# Patient Record
Sex: Female | Born: 1995 | Race: Black or African American | Hispanic: No | Marital: Single | Smoking: Never smoker
Health system: Southern US, Community
[De-identification: ages and names within clinical notes are randomized; demographics above are authoritative.]

## PROBLEM LIST (undated history)

## (undated) DIAGNOSIS — J45909 Unspecified asthma, uncomplicated: Secondary | ICD-10-CM

## (undated) DIAGNOSIS — L309 Dermatitis, unspecified: Secondary | ICD-10-CM

---

## 2017-08-01 ENCOUNTER — Other Ambulatory Visit: Payer: Self-pay

## 2017-08-01 ENCOUNTER — Ambulatory Visit (HOSPITAL_COMMUNITY)
Admission: EM | Admit: 2017-08-01 | Discharge: 2017-08-01 | Disposition: A | Discharge status: Home / Self Care | Payer: Federal, State, Local not specified - PPO

## 2017-08-01 ENCOUNTER — Encounter (HOSPITAL_COMMUNITY): Payer: Self-pay | Admitting: *Deleted

## 2017-08-01 DIAGNOSIS — R0602 Shortness of breath: Secondary | ICD-10-CM

## 2017-08-01 DIAGNOSIS — R059 Cough, unspecified: Secondary | ICD-10-CM

## 2017-08-01 DIAGNOSIS — R05 Cough: Secondary | ICD-10-CM

## 2017-08-01 DIAGNOSIS — R0789 Other chest pain: Secondary | ICD-10-CM

## 2017-08-01 DIAGNOSIS — J4540 Moderate persistent asthma, uncomplicated: Secondary | ICD-10-CM

## 2017-08-01 DIAGNOSIS — R062 Wheezing: Secondary | ICD-10-CM

## 2017-08-01 DIAGNOSIS — J9801 Acute bronchospasm: Secondary | ICD-10-CM

## 2017-08-01 HISTORY — DX: Unspecified asthma, uncomplicated: J45.909

## 2017-08-01 HISTORY — DX: Dermatitis, unspecified: L30.9

## 2017-08-01 MED ORDER — PREDNISONE 20 MG PO TABS: ORAL_TABLET | ORAL | 0 refills | Status: DC

## 2017-08-01 NOTE — ED Provider Notes (Signed)
  MRN: [id number] DOB: 03-Aug-1995  Subjective:   [name] is a 22 y.o. female presenting for 5 month history of persistent shob, wheezing, chest tightness and occasional mid to upper chest pain.  Patient [facility].  She was supposed to be taking Flovent but did not like the way of make her feel so she stopped this on her own.  She has been using her albuterol inhaler 10-12 times per day.  She was given a steroid course back in December 2018 and states that she might of gotten a little better with this.  Today, she denies active chest pain, fever, nausea, vomiting, belly pain.  She takes an over-the-counter medication for allergies but cannot recall what it is.  She is also taking Singulair.  No current facility-administered medications for this encounter.   Current Outpatient Medications:  .  albuterol (PROVENTIL HFA;VENTOLIN HFA) 108 (90 Base) MCG/ACT inhaler, Inhale into the lungs every 6 (six) hours as needed for wheezing or shortness of breath., Disp: , Rfl:  .  albuterol (PROVENTIL) (5 MG/ML) 0.5% nebulizer solution, Take 2.5 mg by nebulization every 6 (six) hours as needed for wheezing or shortness of breath., Disp: , Rfl:  .  fluticasone (FLOVENT HFA) 110 MCG/ACT inhaler, Inhale into the lungs 2 (two) times daily., Disp: , Rfl:  .  montelukast (SINGULAIR) 10 MG tablet, Take 10 mg by mouth at bedtime., Disp: , Rfl:  .  norethindrone-ethinyl estradiol (JUNEL FE,GILDESS FE,LOESTRIN FE) 1-20 MG-MCG tablet, Take 1 tablet by mouth daily., Disp: , Rfl:  .  triamcinolone cream (KENALOG) 0.1 %, Apply 1 application topically 2 (two) times daily., Disp: , Rfl:     Allergies  Allergen Reactions  . Other     Peanuts, raw carrots, raw apples    Past Medical History:  Diagnosis Date  . Asthma   . Eczema     Objective:   Vitals: BP 139/77   Pulse (!) 112   Temp 98.6 F (37 C) (Oral)   Resp (!) 22   LMP 07/10/2017 (Exact Date)   SpO2 98%   Physical Exam   Constitutional: She is oriented to person, place, and time. She appears well-developed and well-nourished.  HENT:  Mouth/Throat: Oropharynx is clear and moist.  No sinus tenderness.  Eyes: Right eye exhibits no discharge. Left eye exhibits no discharge.  Cardiovascular: Regular rhythm and intact distal pulses. Exam reveals no gallop and no friction rub.  No murmur heard. Tachycardia noted.  Pulmonary/Chest: No stridor. No respiratory distress. She has no wheezes. She has no rales. She exhibits no tenderness.  Diminished lung sounds throughout.  Neurological: She is alert and oriented to person, place, and time.  Skin: Skin is warm and dry.  Psychiatric: She has a normal mood and affect.    Assessment and Plan :   Shortness of breath  Wheezing  Atypical chest pain  Cough  Bronchospasm  Moderate persistent extrinsic asthma without complication  Counseled patient on appropriate use of her albuterol inhaler.  Will start patient on a short steroid course.  Physical exam findings are reassuring today, will hold off on chest x-ray.  Patient is to follow-up with her PCP to make sure she gets back onto an inhaled steroid.  In the meantime she is to maintain her allergy medicine, Singulair.   Wallis BambergMani, Maddox Bratcher, PA-C 08/02/17 1408

## 2017-08-01 NOTE — Discharge Instructions (Addendum)
Hydrate well with at least 2 liters (1 gallon) of water daily.  °

## 2017-08-01 NOTE — ED Triage Notes (Signed)
C/O dyspnea regularly over past 5 months.  C/o feeling winded after walking very short distances.  Occasionally has chest pains also.  Occasionally has feeling of wheezing.  Has been taking inhalers and albuterol neb regularly without any relief.  States had course steroids in Dec 2018, but no change.

## 2017-08-02 ENCOUNTER — Encounter (HOSPITAL_COMMUNITY): Payer: Self-pay | Admitting: Urgent Care

## 2017-08-10 ENCOUNTER — Emergency Department (HOSPITAL_COMMUNITY): Payer: Federal, State, Local not specified - PPO

## 2017-08-10 ENCOUNTER — Inpatient Hospital Stay (HOSPITAL_COMMUNITY)
Admission: EM | Admit: 2017-08-10 | Discharge: 2017-08-13 | DRG: 176 | Disposition: A | Discharge status: Home / Self Care | Payer: Federal, State, Local not specified - PPO | Attending: Internal Medicine | Admitting: Internal Medicine

## 2017-08-10 ENCOUNTER — Other Ambulatory Visit: Payer: Self-pay

## 2017-08-10 ENCOUNTER — Encounter (HOSPITAL_COMMUNITY): Payer: Self-pay

## 2017-08-10 DIAGNOSIS — I2729 Other secondary pulmonary hypertension: Secondary | ICD-10-CM | POA: Diagnosis present

## 2017-08-10 DIAGNOSIS — R0603 Acute respiratory distress: Secondary | ICD-10-CM | POA: Diagnosis present

## 2017-08-10 DIAGNOSIS — I2699 Other pulmonary embolism without acute cor pulmonale: Secondary | ICD-10-CM | POA: Diagnosis not present

## 2017-08-10 DIAGNOSIS — N83209 Unspecified ovarian cyst, unspecified side: Secondary | ICD-10-CM | POA: Diagnosis present

## 2017-08-10 DIAGNOSIS — J4530 Mild persistent asthma, uncomplicated: Secondary | ICD-10-CM

## 2017-08-10 DIAGNOSIS — D509 Iron deficiency anemia, unspecified: Secondary | ICD-10-CM | POA: Diagnosis not present

## 2017-08-10 DIAGNOSIS — Z91010 Allergy to peanuts: Secondary | ICD-10-CM

## 2017-08-10 DIAGNOSIS — Z91018 Allergy to other foods: Secondary | ICD-10-CM

## 2017-08-10 DIAGNOSIS — Z91048 Other nonmedicinal substance allergy status: Secondary | ICD-10-CM

## 2017-08-10 DIAGNOSIS — Z7951 Long term (current) use of inhaled steroids: Secondary | ICD-10-CM

## 2017-08-10 DIAGNOSIS — J45909 Unspecified asthma, uncomplicated: Secondary | ICD-10-CM | POA: Diagnosis present

## 2017-08-10 DIAGNOSIS — I2781 Cor pulmonale (chronic): Secondary | ICD-10-CM | POA: Diagnosis present

## 2017-08-10 DIAGNOSIS — Z888 Allergy status to other drugs, medicaments and biological substances status: Secondary | ICD-10-CM

## 2017-08-10 DIAGNOSIS — Z79899 Other long term (current) drug therapy: Secondary | ICD-10-CM

## 2017-08-10 LAB — FOLATE: Folate: 19.6 ng/mL (ref >5.9)

## 2017-08-10 LAB — CBC
HCT: 34.1 % — ABNORMAL LOW (ref 36.0–46.0)
Hemoglobin: 10.1 g/dL — ABNORMAL LOW (ref 12.0–15.0)
MCH: 21.4 pg — AB (ref 26.0–34.0)
MCHC: 29.6 g/dL — AB (ref 30.0–36.0)
MCV: 72.2 fL — AB (ref 78.0–100.0)
Platelets: 391 10*3/uL (ref 150–400)
RBC: 4.72 MIL/uL (ref 3.87–5.11)
RDW: 16.9 % — AB (ref 11.5–15.5)
WBC: 8.9 10*3/uL (ref 4.0–10.5)

## 2017-08-10 LAB — BASIC METABOLIC PANEL
Anion gap: 11 (ref 5–15)
BUN: 11 mg/dL (ref 6–20)
CALCIUM: 9.3 mg/dL (ref 8.9–10.3)
CHLORIDE: 107 mmol/L (ref 101–111)
CO2: 18 mmol/L — AB (ref 22–32)
CREATININE: 0.86 mg/dL (ref 0.44–1.00)
GFR calc Af Amer: >60 mL/min (ref >60)
GFR calc non Af Amer: >60 mL/min (ref >60)
GLUCOSE: 87 mg/dL (ref 65–99)
Potassium: 4.3 mmol/L (ref 3.5–5.1)
Sodium: 136 mmol/L (ref 135–145)

## 2017-08-10 LAB — I-STAT TROPONIN, ED: TROPONIN I, POC: 0 ng/mL (ref 0.00–0.08)

## 2017-08-10 LAB — RETICULOCYTES
RBC.: 4.69 MIL/uL (ref 3.87–5.11)
RETIC COUNT ABSOLUTE: 61 10*3/uL (ref 19.0–186.0)
Retic Ct Pct: 1.3 % (ref 0.4–3.1)

## 2017-08-10 LAB — D-DIMER, QUANTITATIVE: D-Dimer, Quant: 1.77 ug/mL-FEU — ABNORMAL HIGH (ref 0.00–0.50)

## 2017-08-10 LAB — I-STAT BETA HCG BLOOD, ED (MC, WL, AP ONLY): I-stat hCG, quantitative: <5 m[IU]/mL (ref <5)

## 2017-08-10 LAB — TROPONIN I

## 2017-08-10 MED ORDER — SODIUM CHLORIDE 0.9% FLUSH
3.0000 mL | Freq: Two times a day (BID) | INTRAVENOUS | Status: DC
  Administered 2017-08-10 – 2017-08-12 (×4): 3 mL via INTRAVENOUS

## 2017-08-10 MED ORDER — SENNOSIDES-DOCUSATE SODIUM 8.6-50 MG PO TABS: 1.0000 | ORAL_TABLET | Freq: Every evening | ORAL | Status: DC | PRN

## 2017-08-10 MED ORDER — SODIUM CHLORIDE 0.9 % IV SOLN
250.0000 mL | INTRAVENOUS | Status: DC | PRN
  Administered 2017-08-12: 250 mL via INTRAVENOUS

## 2017-08-10 MED ORDER — MONTELUKAST SODIUM 10 MG PO TABS
10.0000 mg | ORAL_TABLET | Freq: Every day | ORAL | Status: DC
  Administered 2017-08-10 – 2017-08-12 (×3): 10 mg via ORAL
  Filled 2017-08-10 (×3): qty 1

## 2017-08-10 MED ORDER — ONDANSETRON HCL 4 MG/2ML IJ SOLN: 4.0000 mg | Freq: Four times a day (QID) | INTRAMUSCULAR | Status: DC | PRN

## 2017-08-10 MED ORDER — IOPAMIDOL (ISOVUE-370) INJECTION 76%
INTRAVENOUS | Status: AC
  Filled 2017-08-10: qty 100

## 2017-08-10 MED ORDER — ALBUTEROL SULFATE (2.5 MG/3ML) 0.083% IN NEBU
2.5000 mg | INHALATION_SOLUTION | Freq: Four times a day (QID) | RESPIRATORY_TRACT | Status: DC | PRN
  Filled 2017-08-10: qty 3

## 2017-08-10 MED ORDER — ONDANSETRON HCL 4 MG PO TABS: 4.0000 mg | ORAL_TABLET | Freq: Four times a day (QID) | ORAL | Status: DC | PRN

## 2017-08-10 MED ORDER — BUDESONIDE 0.25 MG/2ML IN SUSP
0.2500 mg | Freq: Two times a day (BID) | RESPIRATORY_TRACT | Status: DC
  Administered 2017-08-11 – 2017-08-13 (×5): 0.25 mg via RESPIRATORY_TRACT
  Filled 2017-08-10 (×5): qty 2

## 2017-08-10 MED ORDER — HEPARIN (PORCINE) IN NACL 100-0.45 UNIT/ML-% IJ SOLN
1250.0000 [IU]/h | INTRAMUSCULAR | Status: AC
  Administered 2017-08-10: 1500 [IU]/h via INTRAVENOUS
  Administered 2017-08-11: 1400 [IU]/h via INTRAVENOUS
  Administered 2017-08-12 (×2): 1300 [IU]/h via INTRAVENOUS
  Filled 2017-08-10 (×4): qty 250

## 2017-08-10 MED ORDER — SODIUM CHLORIDE 0.9% FLUSH
3.0000 mL | Freq: Two times a day (BID) | INTRAVENOUS | Status: DC
  Administered 2017-08-10 – 2017-08-11 (×2): 3 mL via INTRAVENOUS

## 2017-08-10 MED ORDER — ACETAMINOPHEN 325 MG PO TABS: 650.0000 mg | ORAL_TABLET | Freq: Four times a day (QID) | ORAL | Status: DC | PRN

## 2017-08-10 MED ORDER — HEPARIN BOLUS VIA INFUSION
5500.0000 [IU] | Freq: Once | INTRAVENOUS | Status: AC
  Administered 2017-08-10: 5500 [IU] via INTRAVENOUS
  Filled 2017-08-10: qty 5500

## 2017-08-10 MED ORDER — BISACODYL 5 MG PO TBEC: 5.0000 mg | DELAYED_RELEASE_TABLET | Freq: Every day | ORAL | Status: DC | PRN

## 2017-08-10 MED ORDER — FLUTICASONE PROPIONATE HFA 110 MCG/ACT IN AERO: 1.0000 | INHALATION_SPRAY | Freq: Two times a day (BID) | RESPIRATORY_TRACT | Status: DC

## 2017-08-10 MED ORDER — ACETAMINOPHEN 650 MG RE SUPP: 650.0000 mg | Freq: Four times a day (QID) | RECTAL | Status: DC | PRN

## 2017-08-10 MED ORDER — SODIUM CHLORIDE 0.9% FLUSH: 3.0000 mL | INTRAVENOUS | Status: DC | PRN

## 2017-08-10 MED ORDER — IOPAMIDOL (ISOVUE-370) INJECTION 76%
100.0000 mL | Freq: Once | INTRAVENOUS | Status: AC | PRN
  Administered 2017-08-10: 100 mL via INTRAVENOUS

## 2017-08-10 MED ORDER — HYDROCODONE-ACETAMINOPHEN 5-325 MG PO TABS: 1.0000 | ORAL_TABLET | ORAL | Status: DC | PRN

## 2017-08-10 MED ORDER — KETOROLAC TROMETHAMINE 30 MG/ML IJ SOLN: 30.0000 mg | Freq: Four times a day (QID) | INTRAMUSCULAR | Status: DC | PRN

## 2017-08-10 NOTE — ED Notes (Signed)
Patient currently at CT scan .  

## 2017-08-10 NOTE — Progress Notes (Addendum)
ANTICOAGULATION CONSULT NOTE - Initial Consult  Pharmacy Consult for heparin Indication: pulmonary embolus  Allergies  Allergen Reactions  . Other     Peanuts, raw carrots, raw apples    Patient Measurements: Height: 5\' 9"  (175.3 cm) Weight: 220 lb (99.8 kg) IBW/kg (Calculated) : 66.2 Heparin Dosing Weight: 87.9 kg  Vital Signs: Temp: 98.5 F (36.9 C) (04/23 1835) Temp Source: Oral (04/23 1835) BP: 121/83 (04/23 1944) Pulse Rate: 95 (04/23 1944)  Labs: Recent Labs    08/10/17 1538  HGB 10.1*  HCT 34.1*  PLT 391  CREATININE 0.86    Estimated Creatinine Clearance: 130 mL/min (by C-G formula based on SCr of 0.86 mg/dL).   Medical History: Past Medical History:  Diagnosis Date  . Asthma   . Eczema     Medications:  Scheduled:   Assessment: 22 yo female presented ED on 4/23 with chest pain and SOB. Patient is taking OCPs. CT shows acute PE to both lower lobes within the proximal lobar arteries. Hgb 10.1, plts 391.   Goal of Therapy:  Heparin level 0.3-0.7 units/ml Monitor platelets by anticoagulation protocol: Yes   Plan:  Give 5,500 units bolus x 1 Start heparin infusion at 1,500 units/hr Check anti-Xa level in 6 hours and daily while on heparin Continue to monitor H&H and platelets  Maurico Perrell A Vidal Lampkins 08/10/2017,8:17 PM

## 2017-08-10 NOTE — ED Notes (Signed)
EDP/PA explained admission plan to pt.

## 2017-08-10 NOTE — ED Triage Notes (Signed)
Pt c/o CP that is worse with deep breaths. Pt states that she has had CP since November with her breathing getting worse over the past couple of weeks. Pt states she has been seen by her PCP and has been referred to cardiologist but has not had the apt. Yet.

## 2017-08-10 NOTE — ED Provider Notes (Signed)
Patient placed in Quick Look pathway, seen and evaluated   Chief Complaint: Chest pain, SOB  HPI:   Patient presents for evaluation of intermittent, progressively worsening shortness of breath and chest pain.  She states she has been expressing this for the past several months but worsened over the past couple of weeks.  She states that her symptoms are different today as they have been more persistent since last night.  She woke around 9 AM today with left-sided chest pain which radiated to her back.  She notes associated shortness of breath.  Pain and shortness of breath worsened with any activity.  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  She denies loss of consciousness.  She states that the pain was so bad that she began crying.  She has been seen and evaluated by her primary care physician and an urgent care in the past and was given prednisone which has not been helpful.  She has not used her albuterol inhaler in several days but has been taking Singulair, Advair Diskus without significant relief.  She is a non-smoker.  No recent travel or surgeries, no hemoptysis, no fever.  She is on OCPs.  ROS: Positive for chest pain, shortness of breath, lightheadedness, nausea negative for fever, hemoptysis, syncope  Physical Exam:   Gen: No distress  Neuro: Awake and Alert  Skin: Warm    Focused Exam: Tachycardic, mildly tachypneic.  No murmurs rubs or gallops.  2+ radial and DP/PT pulses bilaterally.  Homans sign absent bilaterally, no lower extremity edema.  Lungs are clear to auscultation bilaterally, no tenderness to palpation of the chest wall.  Speaking in full sentences without difficulty.   Initiation of care has begun. The patient has been counseled on the process, plan, and necessity for staying for the completion/evaluation, and the remainder of the medical screening examination    Bennye AlmFawze, Isamar Wellbrock A, PA-C 08/10/17 1553     Loren RacerYelverton, David, MD 08/12/17 1005

## 2017-08-10 NOTE — ED Notes (Signed)
Patient transported to CT scan . 

## 2017-08-10 NOTE — ED Provider Notes (Signed)
[facility] EMERGENCY DEPARTMENT Provider Note   CSN: [id number] Arrival date & time: 08/10/17  1511     History   Chief Complaint Chief Complaint  Patient presents with  . Chest Pain    HPI Asante Deuser is a 22 y.o. female.  HPI   22 year old female presents today with complaints of chest pain and shortness of breath.  Patient notes several month history of worsening over the last several weeks.  She notes she has been seen as an outpatient by her primary care provider with change in her asthma medications.  Patient notes she has continued to have shortness of breath despite no wheezing.  Patient denies any history DVT or PE.  Patient reports she does not smoke, she does take birth control, denies any lower extremity swelling or edema prolonged immobilization, recent surgeries or trauma.  Patient denies any history of bleeds or coagulopathies.  She does note an aunt with history of pulmonary embolism.  Her.  Past Medical History:  Diagnosis Date  . Asthma   . Eczema     Patient Active Problem List   Diagnosis Date Noted  . Asthma 08/10/2017  . Acute pulmonary embolism (HCC) 08/10/2017    History reviewed. No pertinent surgical history.   OB History   None      Home Medications    Prior to Admission medications   Medication Sig Start Date End Date Taking? Authorizing Provider  albuterol (PROVENTIL HFA;VENTOLIN HFA) 108 (90 Base) MCG/ACT inhaler Inhale into the lungs every 6 (six) hours as needed for wheezing or shortness of breath.    [provider]  albuterol (PROVENTIL) (5 MG/ML) 0.5% nebulizer solution Take 2.5 mg by nebulization every 6 (six) hours as needed for wheezing or shortness of breath.    [provider]  fluticasone (FLOVENT HFA) 110 MCG/ACT inhaler Inhale into the lungs 2 (two) times daily.    [provider]  montelukast (SINGULAIR) 10 MG tablet Take 10 mg by mouth at bedtime.    [provider]  norethindrone-ethinyl estradiol (JUNEL FE,GILDESS FE,LOESTRIN FE) 1-20 MG-MCG tablet Take 1 tablet by mouth daily.    [provider]  predniSONE (DELTASONE) 20 MG tablet Take 2 tablets daily with breakfast. 08/01/17   Wallis Bamberg, PA-C  triamcinolone cream (KENALOG) 0.1 % Apply 1 application topically 2 (two) times daily.    [provider]    Family History History reviewed. No pertinent family history.  Social History Social History   Tobacco Use  . Smoking status: Never Smoker  . Smokeless tobacco: Never Used  Substance Use Topics  . Alcohol use: Never    Frequency: Never  . Drug use: Never     Allergies   Other   Review of Systems Review of Systems  All other systems reviewed and are negative.    Physical Exam Updated Vital Signs BP 134/87   Pulse 89   Temp 98.5 F (36.9 C) (Oral)   Resp 14   Ht 5\' 9"  (1.753 m)   Wt 99.8 kg (220 lb)   LMP 08/07/2017   SpO2 99%   BMI 32.49 kg/m   Physical Exam  Constitutional: She is oriented to person, place, and time. She appears well-developed and well-nourished.  HENT:  Head: Normocephalic and atraumatic.  Eyes: Pupils are equal, round, and reactive to light. Conjunctivae are normal. Right eye exhibits no discharge. Left eye exhibits no discharge. No scleral icterus.  Neck: Normal range of motion. No JVD  present. No tracheal deviation present.  Cardiovascular: Normal rate, regular rhythm, normal heart sounds and intact distal pulses.  Pulmonary/Chest: Effort normal and breath sounds normal. No stridor. No respiratory distress. She has no wheezes. She has no rales. She exhibits no tenderness.  Musculoskeletal: She exhibits no edema.  Neurological: She is alert and oriented to person, place, and time. Coordination normal.  Psychiatric: She has a normal mood and affect. Her behavior is normal. Judgment and thought content normal.  Nursing note and vitals reviewed.    ED Treatments / Results   Labs (all labs ordered are listed, but only abnormal results are displayed) Labs Reviewed  BASIC METABOLIC PANEL - Abnormal; Notable for the following components:      Result Value   CO2 18 (*)    All other components within normal limits  CBC - Abnormal; Notable for the following components:   Hemoglobin 10.1 (*)    HCT 34.1 (*)    MCV 72.2 (*)    MCH 21.4 (*)    MCHC 29.6 (*)    RDW 16.9 (*)    All other components within normal limits  D-DIMER, QUANTITATIVE (NOT AT [facility]) - Abnormal; Notable for the following components:   D-Dimer, Quant 1.77 (*)    All other components within normal limits  HEPARIN LEVEL (UNFRACTIONATED)  CBC  I-STAT TROPONIN, ED  I-STAT BETA HCG BLOOD, ED ([facility], [facility], [facility] ONLY)    EKG None  Radiology Dg Chest 2 View  Result Date: 08/10/2017 CLINICAL DATA:  Left-sided chest pain.  Shortness of breath. EXAM: CHEST - 2 VIEW COMPARISON:  None. FINDINGS: Blunting of the left costophrenic angle is best seen on the AP view suggesting a small effusion or pleural thickening. The heart, hila, and mediastinum are normal. No pneumothorax. No nodules or masses. IMPRESSION: Small left pleural effusion versus pleural thickening. No other acute abnormalities. Electronically Signed   By: Gerome Samavid  Williams III M.D   On: 08/10/2017 16:09   Ct Angio Chest Pe W And/or Wo Contrast  Result Date: 08/10/2017 CLINICAL DATA:  Chest pain since November, worse with deep inspiration and progressing over the past several weeks. EXAM: CT ANGIOGRAPHY CHEST WITH CONTRAST TECHNIQUE: Multidetector CT imaging of the chest was performed using the standard protocol during bolus administration of intravenous contrast. Multiplanar CT image reconstructions and MIPs were obtained to evaluate the vascular anatomy. CONTRAST:  60 cc ISOVUE-370 IOPAMIDOL (ISOVUE-370) INJECTION 76% COMPARISON:  Same day CXR FINDINGS: Cardiovascular: The study is of quality for the evaluation of pulmonary embolism. Bilateral  pulmonary emboli are identified to both lower lobes at the origins of the lobar arteries. RV/LV ratio 0.69. No pericardial effusion or thickening. Heart size is normal. No aortic aneurysm or dissection. Mediastinum/Nodes: No discrete thyroid nodules. Unremarkable esophagus. No pathologically enlarged axillary, mediastinal or hilar lymph nodes. Lungs/Pleura: No pneumothorax. No pleural effusion. Pleural-based opacities in the lingula and left lower lobe compatible with stigmata of pulmonary infarcts or atelectasis. There is some dependent atelectasis bilaterally. No effusion or pneumothorax. Upper abdomen: Unremarkable. Musculoskeletal:  No aggressive appearing focal osseous lesions. Review of the MIP images confirms the above findings. IMPRESSION: 1. Positive for acute PE to both lower lobes within the proximal lobar arteries and borderline right heart strain (RV/LV Ratio = 0.69. 2. Pleural-based lingular and left lower lobe opacities some which are triangular in shape raise suspicion for stigmata of pulmonary infarcts and/or atelectasis. These results were called by telephone at the time of interpretation on 08/10/2017 at 7:42 pm  to PA Burna Forts, who verbally acknowledged these results. ) Electronically Signed   By: Tollie Eth M.D.   On: 08/10/2017 19:45    Procedures Procedures (including critical care time)  Medications Ordered in ED Medications  heparin ADULT infusion 100 units/mL (25000 units/285mL sodium chloride 0.45%) (1,500 Units/hr Intravenous New Bag/Given 08/10/17 2039)  iopamidol (ISOVUE-370) 76 % injection 100 mL (100 mLs Intravenous Contrast Given 08/10/17 1912)  heparin bolus via infusion 5,500 Units (5,500 Units Intravenous Bolus from Bag 08/10/17 2040)     Initial Impression / Assessment and Plan / ED Course  I have reviewed the triage vital signs and the nursing notes.  Pertinent labs & imaging results that were available during my care of the patient were reviewed by me and  considered in my medical decision making (see chart for details).    Labs: I-STAT troponin, i-STAT beta-hCG, BMP, CBC, d-dimer  Imaging: CT angios chest PE  Consults:  Therapeutics:  Discharge Meds:   Assessment/Plan:   22 year old female presents today with acute bilateral pulmonary embolism.  Patient has borderline heart strain, reassuring EKG with no elevation in troponin.  This has been ongoing for prolonged period of time.  Patient has stable vital signs here with reassuring oxygen saturation.  Patient has no contraindications to anticoagulation.  Patient started on heparin, hospital service consulted for hospital admission.  Final Clinical Impressions(s) / ED Diagnoses   Final diagnoses:  Acute pulmonary embolism without acute cor pulmonale, unspecified pulmonary embolism type [facility])    ED Discharge Orders    None       Rosalio Loud 08/10/17 2045    Little, Ambrose Finland, MD 08/17/17 [phone]

## 2017-08-10 NOTE — H&P (Signed)
History and Physical    Fruma Verlon Au ZOX:[id number] DOB: [birth year] DOA: 08/10/2017  PCP: System, Pcp Not In   Patient coming from: Home  Chief Complaint: Pleuritic chest pain   HPI: [name] is a 22 y.o. female with medical history significant for asthma and microcytic anemia, now presenting to the emergency department for evaluation of pleuritic left-sided chest pain.  Patient reports that she developed pain with inspiration or cough at the left chest with radiation to her back in November 2018, but reports that this has worsened significantly over the past few days.  She has also developed shortness of breath.  She denies any significant cough, wheezing, fevers, or chills.  She denies lower extremity swelling or tenderness.  She denies any prolonged immobilization or personal history of DVT or PE.  She reports that her father's great aunt had a PE with circumstances surrounding this unknown.  Denies smoking.  Reports that OCP was started for management of ovarian cyst.  ED Course: Upon arrival to the ED, patient is found to be afebrile, saturating well on room air, and tachycardic to 120 with stable blood pressure.  EKG features a sinus tachycardia with rate 116 and LVH by voltage criteria.  Chemistry panel features a bicarbonate of 18 and CBC is notable for microcytic anemia with hemoglobin of 10.1 and MCV of 72.2, slightly worse than 1 year ago.  Troponin is undetectable and d-dimer is elevated to 1.77.  CTA chest reveals with borderline right heart strain and acute PE in the bilateral lower lobes pleural-based lingular and left lower lobe opacity concerning for stigmata of pulmonary infarct and/or atelectasis.  Patient was started on heparin infusion in the ED, remains hemodynamically stable, and will be observed on the telemetry unit for ongoing evaluation and management.  Review of Systems:  All other systems reviewed and apart from HPI, are negative.  Past Medical History:    Diagnosis Date  . Asthma   . Eczema     History reviewed. No pertinent surgical history.   reports that she has never smoked. She has never used smokeless tobacco. She reports that she does not drink alcohol or use drugs.  Allergies  Allergen Reactions  . Other Diarrhea and Other (See Comments)    Peanuts, raw carrots, raw apples sore throat and stoamch  . Adhesive [Tape] Rash    Band-Aids    Family History  Problem Relation Age of Onset  . Pulmonary embolism Other      Prior to Admission medications   Medication Sig Start Date End Date Taking? Authorizing Provider  albuterol (PROVENTIL HFA;VENTOLIN HFA) 108 (90 Base) MCG/ACT inhaler Inhale into the lungs every 6 (six) hours as needed for wheezing or shortness of breath.    [provider]  albuterol (PROVENTIL) (5 MG/ML) 0.5% nebulizer solution Take 2.5 mg by nebulization every 6 (six) hours as needed for wheezing or shortness of breath.    [provider]  fluticasone (FLOVENT HFA) 110 MCG/ACT inhaler Inhale into the lungs 2 (two) times daily.    [provider]  montelukast (SINGULAIR) 10 MG tablet Take 10 mg by mouth at bedtime.    [provider]  norethindrone-ethinyl estradiol (JUNEL FE,GILDESS FE,LOESTRIN FE) 1-20 MG-MCG tablet Take 1 tablet by mouth daily.    [provider]  predniSONE (DELTASONE) 20 MG tablet Take 2 tablets daily with breakfast. 08/01/17   Wallis Bamberg, PA-C  triamcinolone cream (KENALOG) 0.1 % Apply 1 application topically 2 (two) times daily.  [provider]    Physical Exam: Vitals:   08/10/17 1835 08/10/17 1944 08/10/17 2015 08/10/17 2030  BP: 122/73 121/83 (!) 129/96 134/87  Pulse: 100 95 90 89  Resp: 20 16 14 14   Temp: 98.5 F (36.9 C)     TempSrc: Oral     SpO2: 100% 97% 97% 99%  Weight:      Height:          Constitutional: NAD, calm  Eyes: PERTLA, lids and conjunctivae normal ENMT: Mucous membranes are moist. Posterior  pharynx clear of any exudate or lesions.   Neck: normal, supple, no masses, no thyromegaly Respiratory: clear to auscultation bilaterally. Normal respiratory effort. No accessory muscle use.  Cardiovascular: Rate ~110 and regular. No extremity edema.  Abdomen: No distension, no tenderness, soft. Bowel sounds normal.  Musculoskeletal: no clubbing / cyanosis. No joint deformity upper and lower extremities.    Skin: no significant rashes, lesions, ulcers. Warm, dry, well-perfused. Neurologic: CN 2-12 grossly intact. Sensation intact. Strength 5/5 in all 4 limbs.  Psychiatric:  Alert and oriented x 3. Pleasant and cooperative.     Labs on Admission: I have personally reviewed following labs and imaging studies  CBC: Recent Labs  Lab 08/10/17 1538  WBC 8.9  HGB 10.1*  HCT 34.1*  MCV 72.2*  PLT 391   Basic Metabolic Panel: Recent Labs  Lab 08/10/17 1538  NA 136  K 4.3  CL 107  CO2 18*  GLUCOSE 87  BUN 11  CREATININE 0.86  CALCIUM 9.3   GFR: Estimated Creatinine Clearance: 130 mL/min (by C-G formula based on SCr of 0.86 mg/dL). Liver Function Tests: No results for input(s): AST, ALT, ALKPHOS, BILITOT, PROT, ALBUMIN in the last 168 hours. No results for input(s): LIPASE, AMYLASE in the last 168 hours. No results for input(s): AMMONIA in the last 168 hours. Coagulation Profile: No results for input(s): INR, PROTIME in the last 168 hours. Cardiac Enzymes: No results for input(s): CKTOTAL, CKMB, CKMBINDEX, TROPONINI in the last 168 hours. BNP (last 3 results) No results for input(s): PROBNP in the last 8760 hours. HbA1C: No results for input(s): HGBA1C in the last 72 hours. CBG: No results for input(s): GLUCAP in the last 168 hours. Lipid Profile: No results for input(s): CHOL, HDL, LDLCALC, TRIG, CHOLHDL, LDLDIRECT in the last 72 hours. Thyroid Function Tests: No results for input(s): TSH, T4TOTAL, FREET4, T3FREE, THYROIDAB in the last 72 hours. Anemia Panel: No  results for input(s): VITAMINB12, FOLATE, FERRITIN, TIBC, IRON, RETICCTPCT in the last 72 hours. Urine analysis: No results found for: COLORURINE, APPEARANCEUR, LABSPEC, PHURINE, GLUCOSEU, HGBUR, BILIRUBINUR, KETONESUR, PROTEINUR, UROBILINOGEN, NITRITE, LEUKOCYTESUR Sepsis Labs: @LABRCNTIP (procalcitonin:4,lacticidven:4) )No results found for this or any previous visit (from the past 240 hour(s)).   Radiological Exams on Admission: Dg Chest 2 View  Result Date: 08/10/2017 CLINICAL DATA:  Left-sided chest pain.  Shortness of breath. EXAM: CHEST - 2 VIEW COMPARISON:  None. FINDINGS: Blunting of the left costophrenic angle is best seen on the AP view suggesting a small effusion or pleural thickening. The heart, hila, and mediastinum are normal. No pneumothorax. No nodules or masses. IMPRESSION: Small left pleural effusion versus pleural thickening. No other acute abnormalities. Electronically Signed   By: Gerome Sam III M.D   On: 08/10/2017 16:09   Ct Angio Chest Pe W And/or Wo Contrast  Result Date: 08/10/2017 CLINICAL DATA:  Chest pain since November, worse with deep inspiration and progressing over the past several weeks. EXAM: CT ANGIOGRAPHY CHEST WITH  CONTRAST TECHNIQUE: Multidetector CT imaging of the chest was performed using the standard protocol during bolus administration of intravenous contrast. Multiplanar CT image reconstructions and MIPs were obtained to evaluate the vascular anatomy. CONTRAST:  60 cc ISOVUE-370 IOPAMIDOL (ISOVUE-370) INJECTION 76% COMPARISON:  Same day CXR FINDINGS: Cardiovascular: The study is of quality for the evaluation of pulmonary embolism. Bilateral pulmonary emboli are identified to both lower lobes at the origins of the lobar arteries. RV/LV ratio 0.69. No pericardial effusion or thickening. Heart size is normal. No aortic aneurysm or dissection. Mediastinum/Nodes: No discrete thyroid nodules. Unremarkable esophagus. No pathologically enlarged axillary,  mediastinal or hilar lymph nodes. Lungs/Pleura: No pneumothorax. No pleural effusion. Pleural-based opacities in the lingula and left lower lobe compatible with stigmata of pulmonary infarcts or atelectasis. There is some dependent atelectasis bilaterally. No effusion or pneumothorax. Upper abdomen: Unremarkable. Musculoskeletal:  No aggressive appearing focal osseous lesions. Review of the MIP images confirms the above findings. IMPRESSION: 1. Positive for acute PE to both lower lobes within the proximal lobar arteries and borderline right heart strain (RV/LV Ratio = 0.69. 2. Pleural-based lingular and left lower lobe opacities some which are triangular in shape raise suspicion for stigmata of pulmonary infarcts and/or atelectasis. These results were called by telephone at the time of interpretation on 08/10/2017 at 7:42 pm to PA Burna FortsJeff Hedges, who verbally acknowledged these results. ) Electronically Signed   By: Tollie Ethavid  Kwon M.D.   On: 08/10/2017 19:45    EKG: Independently reviewed. Sinus tachycardia (rate 116), LVH by voltage criteria.   Assessment/Plan   1. Acute pulmonary embolism  - Presents with left-sided pleuritic pain and found to have acute PE in bilateral lower lobes with borderline heart-strain on CT and likely LLL infarct  - She was transiently tachycardic in ED, BP had remained stable  - Stop OCP, continue heparin infusion for now, check echocardiogram and venous US LE's, prn analgesia    2. Asthma  - No wheezing on admission  - Continue Flovent, Singulair, and prn albuterol   3. Microcytic anemia  - Hgb is 10.1 on admission, similar to priors, and MCV is 72.2, lower than prior CBC from 1 yr ago  - Suspected secondary to menorrhagia; no active bleeding  - Check anemia panel - Daily CBC while on heparin infusion     DVT prophylaxis: IV heparin infusion  Code Status: Full  Family Communication: Discussed with patient Consults called: None Admission status: Observation      Briscoe Deutscherimothy S Mearl Harewood, MD [facility] Pager [phone]  If 7PM-7AM, please contact night-coverage [email or web address] Password [email or web address]  08/10/2017, 9:16 PM

## 2017-08-11 ENCOUNTER — Observation Stay (HOSPITAL_BASED_OUTPATIENT_CLINIC_OR_DEPARTMENT_OTHER): Payer: Federal, State, Local not specified - PPO

## 2017-08-11 DIAGNOSIS — D509 Iron deficiency anemia, unspecified: Secondary | ICD-10-CM | POA: Diagnosis present

## 2017-08-11 DIAGNOSIS — I361 Nonrheumatic tricuspid (valve) insufficiency: Secondary | ICD-10-CM | POA: Diagnosis not present

## 2017-08-11 DIAGNOSIS — Z91018 Allergy to other foods: Secondary | ICD-10-CM | POA: Diagnosis not present

## 2017-08-11 DIAGNOSIS — J4530 Mild persistent asthma, uncomplicated: Secondary | ICD-10-CM | POA: Diagnosis not present

## 2017-08-11 DIAGNOSIS — I2729 Other secondary pulmonary hypertension: Secondary | ICD-10-CM | POA: Diagnosis present

## 2017-08-11 DIAGNOSIS — Z91048 Other nonmedicinal substance allergy status: Secondary | ICD-10-CM | POA: Diagnosis not present

## 2017-08-11 DIAGNOSIS — I2609 Other pulmonary embolism with acute cor pulmonale: Secondary | ICD-10-CM

## 2017-08-11 DIAGNOSIS — Z888 Allergy status to other drugs, medicaments and biological substances status: Secondary | ICD-10-CM | POA: Diagnosis not present

## 2017-08-11 DIAGNOSIS — Z79899 Other long term (current) drug therapy: Secondary | ICD-10-CM | POA: Diagnosis not present

## 2017-08-11 DIAGNOSIS — I2699 Other pulmonary embolism without acute cor pulmonale: Secondary | ICD-10-CM

## 2017-08-11 DIAGNOSIS — J45909 Unspecified asthma, uncomplicated: Secondary | ICD-10-CM | POA: Diagnosis present

## 2017-08-11 DIAGNOSIS — R0603 Acute respiratory distress: Secondary | ICD-10-CM | POA: Diagnosis present

## 2017-08-11 DIAGNOSIS — I2781 Cor pulmonale (chronic): Secondary | ICD-10-CM | POA: Diagnosis present

## 2017-08-11 DIAGNOSIS — Z7951 Long term (current) use of inhaled steroids: Secondary | ICD-10-CM | POA: Diagnosis not present

## 2017-08-11 DIAGNOSIS — N83209 Unspecified ovarian cyst, unspecified side: Secondary | ICD-10-CM | POA: Diagnosis present

## 2017-08-11 DIAGNOSIS — Z91010 Allergy to peanuts: Secondary | ICD-10-CM | POA: Diagnosis not present

## 2017-08-11 LAB — BASIC METABOLIC PANEL
ANION GAP: 10 (ref 5–15)
BUN: 8 mg/dL (ref 6–20)
CALCIUM: 9.1 mg/dL (ref 8.9–10.3)
CO2: 21 mmol/L — ABNORMAL LOW (ref 22–32)
Chloride: 106 mmol/L (ref 101–111)
Creatinine, Ser: 0.84 mg/dL (ref 0.44–1.00)
Glucose, Bld: 136 mg/dL — ABNORMAL HIGH (ref 65–99)
Potassium: 4 mmol/L (ref 3.5–5.1)
Sodium: 137 mmol/L (ref 135–145)

## 2017-08-11 LAB — TROPONIN I: Troponin I: <0.03 ng/mL (ref <0.03)

## 2017-08-11 LAB — CBC
HEMATOCRIT: 33.6 % — AB (ref 36.0–46.0)
HEMOGLOBIN: 9.9 g/dL — AB (ref 12.0–15.0)
MCH: 21.3 pg — ABNORMAL LOW (ref 26.0–34.0)
MCHC: 29.5 g/dL — ABNORMAL LOW (ref 30.0–36.0)
MCV: 72.3 fL — ABNORMAL LOW (ref 78.0–100.0)
Platelets: 352 10*3/uL (ref 150–400)
RBC: 4.65 MIL/uL (ref 3.87–5.11)
RDW: 17.2 % — ABNORMAL HIGH (ref 11.5–15.5)
WBC: 7.5 10*3/uL (ref 4.0–10.5)

## 2017-08-11 LAB — HEPARIN LEVEL (UNFRACTIONATED)
HEPARIN UNFRACTIONATED: 0.83 [IU]/mL — AB (ref 0.30–0.70)
Heparin Unfractionated: 0.72 IU/mL — ABNORMAL HIGH (ref 0.30–0.70)
Heparin Unfractionated: 0.46 IU/mL (ref 0.30–0.70)

## 2017-08-11 LAB — PROTIME-INR
INR: 1.18
PROTHROMBIN TIME: 14.9 s (ref 11.4–15.2)

## 2017-08-11 LAB — IRON AND TIBC
IRON: 21 ug/dL — AB (ref 28–170)
Saturation Ratios: 5 % — ABNORMAL LOW (ref 10.4–31.8)
TIBC: 459 ug/dL — ABNORMAL HIGH (ref 250–450)
UIBC: 438 ug/dL

## 2017-08-11 LAB — ECHOCARDIOGRAM COMPLETE
Height: 69 in
WEIGHTICAEL: 3520 [oz_av]

## 2017-08-11 LAB — HIV ANTIBODY (ROUTINE TESTING W REFLEX): HIV SCREEN 4TH GENERATION: NONREACTIVE

## 2017-08-11 LAB — VITAMIN B12: Vitamin B-12: 251 pg/mL (ref 180–914)

## 2017-08-11 LAB — FERRITIN: Ferritin: 1 ng/mL — ABNORMAL LOW (ref 11–307)

## 2017-08-11 MED ORDER — TRAMADOL HCL 50 MG PO TABS: 50.0000 mg | ORAL_TABLET | Freq: Four times a day (QID) | ORAL | Status: DC | PRN

## 2017-08-11 NOTE — Consult Note (Signed)
Name: [name] MRN: [id number] DOB: December 12, 1995    ADMISSION DATE:  08/10/2017 CONSULTATION DATE: 08/11/2017  REFERRING MD : [facility]  CHIEF COMPLAINT: Back and chest wall pain  BRIEF PATIENT DESCRIPTION: Well-nourished well-developed 22 year old female  SIGNIFICANT EVENTS  08/11/2017 evaluation for possible mucous  STUDIES:   CT of the chest was noted 2D echo was noted   HISTORY OF PRESENT ILLNESS:   22 year old F American female who reports back and chest pain intermittently with on a pain scale of 2-8 (November 2018.  She does have pain got worse over the last 24-48 hours and she presented to the hospital had a CT scan of the chest revealed bilateral lower lobe pulmonary embolism.  Subsequently she had a 2D echo performed on 08/10/2017 which showed severe right ventricular dysfunction, enlarged and sooner RV concerning for thrombus mild tricuspid valve regurgitation and moderate pulmonary hypertension.  Pulmonary critical care was consult it on 08/11/2017 for questionable EKOS.  She does meet the criteria for a excess.  Interventional radiology will be contacted for further evaluation and treatment. She has a past medical history significant for asthma.  Does take oral contraceptives for management of ovarian cyst.  Denies any recent trauma to her legs.  No recent travel that coincides with the starting of her symptoms.  Normally healthy and in no acute distress.  She does have a history of a great aunt have a pulmonary embolism in the past.  She currently is on heparin drip which precludes most of the workup for clotting disorders.  These can be's considered in the near future.  PAST MEDICAL HISTORY :   has a past medical history of Asthma and Eczema.  has no past surgical history on file. Prior to Admission medications   Medication Sig Start Date End Date Taking? Authorizing Provider  albuterol (PROVENTIL) (5 MG/ML) 0.5% nebulizer solution Take 2.5 mg by nebulization every 6 (six)  hours as needed for wheezing or shortness of breath.   Yes [provider]  fluticasone (FLONASE) 50 MCG/ACT nasal spray Place 1-2 sprays into both nostrils daily as needed (for seasonal allergies).  07/28/17  Yes [provider]  Fluticasone-Salmeterol (ADVAIR DISKUS) 250-50 MCG/DOSE AEPB Inhale 1 puff into the lungs every 12 (twelve) hours. 08/06/17 08/06/18 Yes [provider]  levalbuterol Pauline Aus HFA) 45 MCG/ACT inhaler Inhale 2 puffs into the lungs every 6 (six) hours as needed for wheezing or shortness of breath.  08/06/17  Yes [provider]  montelukast (SINGULAIR) 10 MG tablet Take 10 mg by mouth at bedtime.   Yes [provider]  triamcinolone cream (KENALOG) 0.1 % Apply 1 application topically 2 (two) times daily as needed (for itching).    Yes [provider]  norethindrone-ethinyl estradiol (JUNEL FE,GILDESS FE,LOESTRIN FE) 1-20 MG-MCG tablet Take 1 tablet by mouth daily.    [provider]   Allergies  Allergen Reactions  . Apple Diarrhea, Itching, Nausea Only and Other (See Comments)    Sore stomach and throat, per mom (NO RAW APPLES!!)  . Carrot [Daucus Carota] Diarrhea and Other (See Comments)    Sore throat and stomach, also (NO RAW CARROTS!!)  . Flovent Hfa [Fluticasone] Nausea Only and Other (See Comments)    Made chest hurt also  . Peanut Oil Other (See Comments)    Throat will become sore and stomach hurts (No hives or trouble breathing)  . Adhesive [Tape] Rash    Band-Aids    FAMILY HISTORY:  family history includes Pulmonary  embolism in her other. SOCIAL HISTORY:  reports that she has never smoked. She has never used smokeless tobacco. She reports that she does not drink alcohol or use drugs.  REVIEW OF SYSTEMS:   10 point review of system taken, please see HPI for positives and negatives.   SUBJECTIVE:   VITAL SIGNS: Temp:  [97.9 F (36.6 C)-98.5 F (36.9 C)] 98.5 F (36.9 C) (04/24  0803) Pulse Rate:  [83-124] 83 (04/24 0931) Resp:  [14-26] 16 (04/24 0931) BP: (103-136)/(73-101) 119/76 (04/24 0803) SpO2:  [97 %-100 %] 100 % (04/24 0931) Weight:  [99.8 kg (220 lb)] 99.8 kg (220 lb) (04/23 1518)  PHYSICAL EXAMINATION: General: Well-nourished well-developed female no acute distress Neuro: Neurologically intact HEENT: No JVD or lymphadenopathy is appreciated Cardiovascular: Heart sounds are regular in rate and rhythm Lungs: Clear to auscultation without wheeze Abdomen: Obese soft nontender Musculoskeletal: Lower extremities are intact no evidence of trauma Skin: Warm and dry  Recent Labs  Lab 08/10/17 1538 08/11/17 0237  NA 136 137  K 4.3 4.0  CL 107 106  CO2 18* 21*  BUN 11 8  CREATININE 0.86 0.84  GLUCOSE 87 136*   Recent Labs  Lab 08/10/17 1538 08/11/17 0237  HGB 10.1* 9.9*  HCT 34.1* 33.6*  WBC 8.9 7.5  PLT 391 352   Dg Chest 2 View  Result Date: 08/10/2017 CLINICAL DATA:  Left-sided chest pain.  Shortness of breath. EXAM: CHEST - 2 VIEW COMPARISON:  None. FINDINGS: Blunting of the left costophrenic angle is best seen on the AP view suggesting a small effusion or pleural thickening. The heart, hila, and mediastinum are normal. No pneumothorax. No nodules or masses. IMPRESSION: Small left pleural effusion versus pleural thickening. No other acute abnormalities. Electronically Signed   By: Gerome Samavid  Williams III M.D   On: 08/10/2017 16:09   Ct Angio Chest Pe W And/or Wo Contrast  Result Date: 08/10/2017 CLINICAL DATA:  Chest pain since November, worse with deep inspiration and progressing over the past several weeks. EXAM: CT ANGIOGRAPHY CHEST WITH CONTRAST TECHNIQUE: Multidetector CT imaging of the chest was performed using the standard protocol during bolus administration of intravenous contrast. Multiplanar CT image reconstructions and MIPs were obtained to evaluate the vascular anatomy. CONTRAST:  60 cc ISOVUE-370 IOPAMIDOL (ISOVUE-370) INJECTION 76%  COMPARISON:  Same day CXR FINDINGS: Cardiovascular: The study is of quality for the evaluation of pulmonary embolism. Bilateral pulmonary emboli are identified to both lower lobes at the origins of the lobar arteries. RV/LV ratio 0.69. No pericardial effusion or thickening. Heart size is normal. No aortic aneurysm or dissection. Mediastinum/Nodes: No discrete thyroid nodules. Unremarkable esophagus. No pathologically enlarged axillary, mediastinal or hilar lymph nodes. Lungs/Pleura: No pneumothorax. No pleural effusion. Pleural-based opacities in the lingula and left lower lobe compatible with stigmata of pulmonary infarcts or atelectasis. There is some dependent atelectasis bilaterally. No effusion or pneumothorax. Upper abdomen: Unremarkable. Musculoskeletal:  No aggressive appearing focal osseous lesions. Review of the MIP images confirms the above findings. IMPRESSION: 1. Positive for acute PE to both lower lobes within the proximal lobar arteries and borderline right heart strain (RV/LV Ratio = 0.69. 2. Pleural-based lingular and left lower lobe opacities some which are triangular in shape raise suspicion for stigmata of pulmonary infarcts and/or atelectasis. These results were called by telephone at the time of interpretation on 08/10/2017 at 7:42 pm to PA Burna FortsJeff Hedges, who verbally acknowledged these results. ) Electronically Signed   By: Tollie Ethavid  Kwon M.D.   On:  08/10/2017 19:45    ASSESSMENT: Principal Problem:   Acute pulmonary embolism (HCC)- Mild global reduction in LV systolic function; mild RVE with   severe RV dysfunction; large density in RV concerning for   thrombus; mild TR with moderate pulmonary hypertension   Asthma   Microcytic anemia   Ovarian cyst   Discussion: 21 year old F American female who reports back and chest pain intermittently with on a pain scale of 2-8 (November 2018.  She does have pain got worse over the last 24-48 hours and she presented to the hospital had a CT scan  of the chest revealed bilateral lower lobe pulmonary embolism.  Subsequently she had a 2D echo performed on 08/10/2017 which showed severe right ventricular dysfunction, enlarged and sooner RV concerning for thrombus mild tricuspid valve regurgitation and moderate pulmonary hypertension.  Pulmonary critical care was consult it on 08/11/2017 for questionable EKOS.  She does meet the criteria for a excess.  Interventional radiology will be contacted for further evaluation and treatment. She has a past medical history significant for asthma.  Does take oral contraceptives for management of ovarian cyst.  Denies any recent trauma to her legs.  No recent travel that coincides with the starting of her symptoms.  Normally healthy and in no acute distress.  She does have a history of a great aunt have a pulmonary embolism in the past.  She currently is on heparin drip which precludes most of the workup for clotting disorders.  These can be's considered in the near future.   PLAN:  Heparin drip as ordered  Contact interventional radiology for questionable EKOS   Steve Kerin Kren ACNP [facility] Pager [phone] till 1 pm If no answer page [phone] 08/11/2017, 10:36 AM

## 2017-08-11 NOTE — Progress Notes (Signed)
ANTICOAGULATION CONSULT NOTE  Pharmacy Consult for Heparin Indication: pulmonary embolus  Allergies  Allergen Reactions  . Apple Diarrhea, Itching, Nausea Only and Other (See Comments)    Sore stomach and throat, per mom (NO RAW APPLES!!)  . Carrot [Daucus Carota] Diarrhea and Other (See Comments)    Sore throat and stomach, also (NO RAW CARROTS!!)  . Flovent Hfa [Fluticasone] Nausea Only and Other (See Comments)    Made chest hurt also  . Peanut Oil Other (See Comments)    Throat will become sore and stomach hurts (No hives or trouble breathing)  . Adhesive [Tape] Rash    Band-Aids   Patient Measurements: Height: 5\' 9"  (175.3 cm) Weight: 220 lb (99.8 kg) IBW/kg (Calculated) : 66.2 Heparin Dosing Weight: 87.9 kg  Vital Signs: Temp: 98.5 F (36.9 C) (04/24 0803) Temp Source: Oral (04/24 0803) BP: 119/76 (04/24 0803) Pulse Rate: 83 (04/24 0931)  Labs: Recent Labs    08/10/17 1538 08/10/17 2105 08/11/17 0237 08/11/17 1017 08/11/17 1150  HGB 10.1*  --  9.9*  --   --   HCT 34.1*  --  33.6*  --   --   PLT 391  --  352  --   --   LABPROT  --   --   --   --  14.9  INR  --   --   --   --  1.18  HEPARINUNFRC  --   --  0.83* 0.72*  --   CREATININE 0.86  --  0.84  --   --   TROPONINI  --  <0.03 <0.03 <0.03  --    Estimated Creatinine Clearance: 133.1 mL/min (by C-G formula based on SCr of 0.84 mg/dL).  Medical History: Past Medical History:  Diagnosis Date  . Asthma   . Eczema    Assessment: 22 yo female presented ED on 4/23 with chest pain and SOB. Patient is taking OCPs. CT shows acute PE to both lower lobes within the proximal lobar arteries. Heparin level is down to 0.72 after rate decrease.   Goal of Therapy:  Heparin level 0.3-0.7 units/ml Monitor platelets by anticoagulation protocol: Yes   Plan:  Decrease heparin to 1,300 units/hr Monitor daily heparin level, CBC, s/s of bleed  Aparna Vanderweele J 08/11/2017,12:33 PM

## 2017-08-11 NOTE — Progress Notes (Signed)
ANTICOAGULATION CONSULT NOTE  Pharmacy Consult for Heparin Indication: pulmonary embolus  Allergies  Allergen Reactions  . Apple Diarrhea, Itching, Nausea Only and Other (See Comments)    Sore stomach and throat, per mom (NO RAW APPLES!!)  . Carrot [Daucus Carota] Diarrhea and Other (See Comments)    Sore throat and stomach, also (NO RAW CARROTS!!)  . Flovent Hfa [Fluticasone] Nausea Only and Other (See Comments)    Made chest hurt also  . Peanut Oil Other (See Comments)    Throat will become sore and stomach hurts (No hives or trouble breathing)  . Adhesive [Tape] Rash    Band-Aids   Patient Measurements: Height: 5\' 9"  (175.3 cm) Weight: 220 lb (99.8 kg) IBW/kg (Calculated) : 66.2 Heparin Dosing Weight: 87.9 kg  Vital Signs: Temp: 99 F (37.2 C) (04/24 1631) Temp Source: Oral (04/24 1631) BP: 118/83 (04/24 1631) Pulse Rate: 89 (04/24 1631)  Labs: Recent Labs    08/10/17 1538 08/10/17 2105 08/11/17 0237 08/11/17 1017 08/11/17 1150 08/11/17 1940  HGB 10.1*  --  9.9*  --   --   --   HCT 34.1*  --  33.6*  --   --   --   PLT 391  --  352  --   --   --   LABPROT  --   --   --   --  14.9  --   INR  --   --   --   --  1.18  --   HEPARINUNFRC  --   --  0.83* 0.72*  --  0.46  CREATININE 0.86  --  0.84  --   --   --   TROPONINI  --  <0.03 <0.03 <0.03  --   --    Estimated Creatinine Clearance: 133.1 mL/min (by C-G formula based on SCr of 0.84 mg/dL).  Medical History: Past Medical History:  Diagnosis Date  . Asthma   . Eczema    Assessment: 22 yo female presented ED on 4/23 with chest pain and SOB. Patient is taking OCPs. CT shows acute PE to both lower lobes within the proximal lobar arteries. Heparin level is down to 0.46 after rate decrease.   Goal of Therapy:  Heparin level 0.3-0.7 units/ml Monitor platelets by anticoagulation protocol: Yes   Plan:  Continue heparin at 1,300 units/hr Heparin level in AM Monitor daily heparin level, CBC, s/s of  bleed  Icyss Skog A. Jeanella CrazePierce, PharmD, BCPS Clinical Pharmacist [place] Pager: [phone]  08/11/2017,8:37 PM

## 2017-08-11 NOTE — Progress Notes (Signed)
  Echocardiogram 2D Echocardiogram has been performed.  [name], [name] 08/11/2017, 9:25 AM

## 2017-08-11 NOTE — Progress Notes (Signed)
ANTICOAGULATION CONSULT NOTE  Pharmacy Consult for Heparin Indication: pulmonary embolus  Allergies  Allergen Reactions  . Apple Diarrhea, Itching, Nausea Only and Other (See Comments)    Sore stomach and throat, per mom (NO RAW APPLES!!)  . Carrot [Daucus Carota] Diarrhea and Other (See Comments)    Sore throat and stomach, also (NO RAW CARROTS!!)  . Flovent Hfa [Fluticasone] Nausea Only and Other (See Comments)    Made chest hurt also  . Peanut Oil Other (See Comments)    Throat will become sore and stomach hurts (No hives or trouble breathing)  . Adhesive [Tape] Rash    Band-Aids   Patient Measurements: Height: 5\' 9"  (175.3 cm) Weight: 220 lb (99.8 kg) IBW/kg (Calculated) : 66.2 Heparin Dosing Weight: 87.9 kg  Vital Signs: Temp: 98 F (36.7 C) (04/23 2233) Temp Source: Oral (04/23 2233) BP: 133/87 (04/23 2233) Pulse Rate: 83 (04/23 2233)  Labs: Recent Labs    08/10/17 1538 08/10/17 2105 08/11/17 0237  HGB 10.1*  --  9.9*  HCT 34.1*  --  33.6*  PLT 391  --  352  HEPARINUNFRC  --   --  0.83*  CREATININE 0.86  --   --   TROPONINI  --  <0.03  --     Estimated Creatinine Clearance: 130 mL/min (by C-G formula based on SCr of 0.86 mg/dL).   Medical History: Past Medical History:  Diagnosis Date  . Asthma   . Eczema     Assessment: 22 yo female presented ED on 4/23 with chest pain and SOB. Patient is taking OCPs. CT shows acute PE to both lower lobes within the proximal lobar arteries.   4/24 AM: heparin level is supra-therapeutic this AM, Hgb stable  Goal of Therapy:  Heparin level 0.3-0.7 units/ml Monitor platelets by anticoagulation protocol: Yes   Plan:  Dec heparin to 1400 units/hr 1100 HL  [name], [name] 08/11/2017,3:29 AM

## 2017-08-11 NOTE — Progress Notes (Signed)
PROGRESS NOTE    [name]  ZOX:[id number] DOB: 1995/11/30 DOA: 08/10/2017 PCP: System, Pcp Not In   Outpatient Specialists:     Brief Narrative:   [name] is a 22 y.o. female with medical history significant for asthma and microcytic anemia, now presenting to the emergency department for evaluation of pleuritic left-sided chest pain.  Patient reports that she developed pain with inspiration or cough at the left chest with radiation to her back in November 2018, but reports that this has worsened significantly over the past few days.  She has also developed shortness of breath.  She denies any significant cough, wheezing, fevers, or chills.  She denies lower extremity swelling or tenderness.  She denies any prolonged immobilization or personal history of DVT or PE.  She reports that her father's great aunt had a PE with circumstances surrounding this unknown.  Denies smoking.  Reports that OCP was started for management of ovarian cyst, 2 years ago.  Echo show clot in the right ventricle.  PCCM/IR consult for possible lysis.       Assessment & Plan:   Principal Problem:   Acute pulmonary embolism (HCC) Active Problems:   Asthma   Microcytic anemia   Acute pulmonary embolism  - Presents with left-sided pleuritic pain and found to have acute PE in bilateral lower lobes with borderline heart-strain on CT and likely LLL infarct  -LE duplex negative -echo with right ventricle clot -asked PCCM to eval along with IR-- discussed with IR-- plan is for continued heparin-- if patient were to become unstable-- hypoxic, hypotensive etc they would reconsider ECOS -hypercoagulable work up after treatment of clot -will ask for benefits check but for now-- heparin gtt with pharmacy managing   Asthma  - No wheezing on admission  - Continue Flovent, Singulair, and prn albuterol   Microcytic anemia  - Hgb is 10.1 on admission, similar to priors, and MCV is 72.2, lower than prior CBC  from 1 yr ago  - Suspected secondary to menorrhagia; no active bleeding  - IV Fe before discharge      DVT prophylaxis:  Fully anticoagulated   Code Status: Full Code   Family Communication: Mom at beside with dad on phone  Disposition Plan:  Pending improvement on heparin   Consultants:  IR PCCM  Subjective: Some pain with deep breathing -is a junior at [employer]  Objective: Vitals:   08/10/17 2211 08/10/17 2233 08/11/17 0803 08/11/17 0931  BP:  133/87 119/76   Pulse:  83 84 83  Resp:   16 16  Temp: 98.2 F (36.8 C) 98 F (36.7 C) 98.5 F (36.9 C)   TempSrc: Oral Oral Oral   SpO2:  97% 100% 100%  Weight:      Height:        Intake/Output Summary (Last 24 hours) at 08/11/2017 1337 Last data filed at 08/11/2017 0817 Gross per 24 hour  Intake 110 ml  Output 400 ml  Net -290 ml   Filed Weights   08/10/17 1518  Weight: 99.8 kg (220 lb)    Examination:  General exam: in bed, NAD Respiratory system: tight but no wheezing Cardiovascular system: S1 & S2 heard, RRR. No JVD, murmurs, rubs, gallops or clicks. No pedal edema. Gastrointestinal system: Abdomen is nondistended, soft and nontender. No organomegaly or masses felt. Normal bowel sounds heard. Central nervous system: Alert and oriented. No focal neurological deficits. Extremities: Symmetric 5 x 5 power. Skin: No rashes, lesions or ulcers Psychiatry: Judgement and  insight appear normal. Mood & affect appropriate.     Data Reviewed: I have personally reviewed following labs and imaging studies  CBC: Recent Labs  Lab 08/10/17 1538 08/11/17 0237  WBC 8.9 7.5  HGB 10.1* 9.9*  HCT 34.1* 33.6*  MCV 72.2* 72.3*  PLT 391 352   Basic Metabolic Panel: Recent Labs  Lab 08/10/17 1538 08/11/17 0237  NA 136 137  K 4.3 4.0  CL 107 106  CO2 18* 21*  GLUCOSE 87 136*  BUN 11 8  CREATININE 0.86 0.84  CALCIUM 9.3 9.1   GFR: Estimated Creatinine Clearance: 133.1 mL/min (by C-G formula based on SCr  of 0.84 mg/dL). Liver Function Tests: No results for input(s): AST, ALT, ALKPHOS, BILITOT, PROT, ALBUMIN in the last 168 hours. No results for input(s): LIPASE, AMYLASE in the last 168 hours. No results for input(s): AMMONIA in the last 168 hours. Coagulation Profile: Recent Labs  Lab 08/11/17 1150  INR 1.18   Cardiac Enzymes: Recent Labs  Lab 08/10/17 2105 08/11/17 0237 08/11/17 1017  TROPONINI <0.03 <0.03 <0.03   BNP (last 3 results) No results for input(s): PROBNP in the last 8760 hours. HbA1C: No results for input(s): HGBA1C in the last 72 hours. CBG: No results for input(s): GLUCAP in the last 168 hours. Lipid Profile: No results for input(s): CHOL, HDL, LDLCALC, TRIG, CHOLHDL, LDLDIRECT in the last 72 hours. Thyroid Function Tests: No results for input(s): TSH, T4TOTAL, FREET4, T3FREE, THYROIDAB in the last 72 hours. Anemia Panel: Recent Labs    08/10/17 2105  VITAMINB12 251  FOLATE 19.6  FERRITIN 1*  TIBC 459*  IRON 21*  RETICCTPCT 1.3   Urine analysis: No results found for: COLORURINE, APPEARANCEUR, LABSPEC, PHURINE, GLUCOSEU, HGBUR, BILIRUBINUR, KETONESUR, PROTEINUR, UROBILINOGEN, NITRITE, LEUKOCYTESUR   )No results found for this or any previous visit (from the past 240 hour(s)).    Anti-infectives (From admission, onward)   None       Radiology Studies: Dg Chest 2 View  Result Date: 08/10/2017 CLINICAL DATA:  Left-sided chest pain.  Shortness of breath. EXAM: CHEST - 2 VIEW COMPARISON:  None. FINDINGS: Blunting of the left costophrenic angle is best seen on the AP view suggesting a small effusion or pleural thickening. The heart, hila, and mediastinum are normal. No pneumothorax. No nodules or masses. IMPRESSION: Small left pleural effusion versus pleural thickening. No other acute abnormalities. Electronically Signed   By: Gerome Samavid  Williams III M.D   On: 08/10/2017 16:09   Ct Angio Chest Pe W And/or Wo Contrast  Result Date: 08/10/2017 CLINICAL  DATA:  Chest pain since November, worse with deep inspiration and progressing over the past several weeks. EXAM: CT ANGIOGRAPHY CHEST WITH CONTRAST TECHNIQUE: Multidetector CT imaging of the chest was performed using the standard protocol during bolus administration of intravenous contrast. Multiplanar CT image reconstructions and MIPs were obtained to evaluate the vascular anatomy. CONTRAST:  60 cc ISOVUE-370 IOPAMIDOL (ISOVUE-370) INJECTION 76% COMPARISON:  Same day CXR FINDINGS: Cardiovascular: The study is of quality for the evaluation of pulmonary embolism. Bilateral pulmonary emboli are identified to both lower lobes at the origins of the lobar arteries. RV/LV ratio 0.69. No pericardial effusion or thickening. Heart size is normal. No aortic aneurysm or dissection. Mediastinum/Nodes: No discrete thyroid nodules. Unremarkable esophagus. No pathologically enlarged axillary, mediastinal or hilar lymph nodes. Lungs/Pleura: No pneumothorax. No pleural effusion. Pleural-based opacities in the lingula and left lower lobe compatible with stigmata of pulmonary infarcts or atelectasis. There is some dependent atelectasis bilaterally.  No effusion or pneumothorax. Upper abdomen: Unremarkable. Musculoskeletal:  No aggressive appearing focal osseous lesions. Review of the MIP images confirms the above findings. IMPRESSION: 1. Positive for acute PE to both lower lobes within the proximal lobar arteries and borderline right heart strain (RV/LV Ratio = 0.69. 2. Pleural-based lingular and left lower lobe opacities some which are triangular in shape raise suspicion for stigmata of pulmonary infarcts and/or atelectasis. These results were called by telephone at the time of interpretation on 08/10/2017 at 7:42 pm to PA Burna Forts, who verbally acknowledged these results. ) Electronically Signed   By: Tollie Eth M.D.   On: 08/10/2017 19:45        Scheduled Meds: . budesonide (PULMICORT) nebulizer solution  0.25 mg  Nebulization BID  . montelukast  10 mg Oral QHS  . sodium chloride flush  3 mL Intravenous Q12H  . sodium chloride flush  3 mL Intravenous Q12H   Continuous Infusions: . sodium chloride    . heparin 1,300 Units/hr (08/11/17 1249)     LOS: 0 days    Time spent: 35 min    Joseph Art, DO [facility] Pager [phone]  If 7PM-7AM, please contact night-coverage [email or web address] Password [facility] 08/11/2017, 1:37 PM

## 2017-08-11 NOTE — Progress Notes (Signed)
*  Preliminary Results* Bilateral lower extremity venous duplex completed. Bilateral lower extremities are negative for deep vein thrombosis. There is no evidence of Baker's cyst bilaterally.  08/11/2017 9:03 AM Gertie FeyMichelle Janeese Mcgloin, BS, RVT, RDCS, RDMS

## 2017-08-11 NOTE — Consult Note (Signed)
Chief Complaint: Patient was seen in consultation today for consideration of Pulmonary embolus Ekos thrombolysis Chief Complaint  Patient presents with  . Chest Pain   at the request of CCM  Referring Physician(s): Dr Vira Browns  Supervising Physician: Jolaine Click  Patient Status: [facility] - In-pt  History of Present Illness: [name] is a 22 y.o. female   Sob intermittently for months Last few weeks-- progressively worse To ED with SOB and CP 4/23 CTA: 1. Positive for acute PE to both lower lobes within the proximal lobar arteries and borderline right heart strain (RV/LV Ratio = 0.69. 2. Pleural-based lingular and left lower lobe opacities some which are triangular in shape raise suspicion for stigmata of pulmonary infarcts and/or atelectasis.  2D Echo: Mild global reduction in LV systolic function; mild RVE with   severe RV dysfunction; large density in RV concerning for   thrombus; mild TR with moderate pulmonary hypertension;  [place] LE: Final Interpretation: Right: There is no evidence of deep vein thrombosis in the lower extremity. No cystic structure found in the popliteal fossa. Left: There is no evidence of deep vein thrombosis in the lower extremity. No cystic structure found in the popliteal fossa.  Pt feeling better already On Heparin drip Less SOB and less CP Breathing easier Using OCP for ovarian cyst Denies surgeries; denies injury  100% RA; restful; In NAD VSS Troponin wnl   Request for consideration of EKOS thrombolysis   Past Medical History:  Diagnosis Date  . Asthma   . Eczema     History reviewed. No pertinent surgical history.  Allergies: Apple; Carrot [daucus carota]; Flovent hfa [fluticasone]; Peanut oil; and Adhesive [tape]  Medications: Prior to Admission medications   Medication Sig Start Date End Date Taking? Authorizing Provider  albuterol (PROVENTIL) (5 MG/ML) 0.5% nebulizer solution Take 2.5 mg by nebulization every 6  (six) hours as needed for wheezing or shortness of breath.   Yes [provider]  fluticasone (FLONASE) 50 MCG/ACT nasal spray Place 1-2 sprays into both nostrils daily as needed (for seasonal allergies).  07/28/17  Yes [provider]  Fluticasone-Salmeterol (ADVAIR DISKUS) 250-50 MCG/DOSE AEPB Inhale 1 puff into the lungs every 12 (twelve) hours. 08/06/17 08/06/18 Yes [provider]  levalbuterol Pauline Aus HFA) 45 MCG/ACT inhaler Inhale 2 puffs into the lungs every 6 (six) hours as needed for wheezing or shortness of breath.  08/06/17  Yes [provider]  montelukast (SINGULAIR) 10 MG tablet Take 10 mg by mouth at bedtime.   Yes [provider]  triamcinolone cream (KENALOG) 0.1 % Apply 1 application topically 2 (two) times daily as needed (for itching).    Yes [provider]  norethindrone-ethinyl estradiol (JUNEL FE,GILDESS FE,LOESTRIN FE) 1-20 MG-MCG tablet Take 1 tablet by mouth daily.    [provider]     Family History  Problem Relation Age of Onset  . Pulmonary embolism Other     Social History   Socioeconomic History  . Marital status: Single    Spouse name: Not on file  . Number of children: Not on file  . Years of education: Not on file  . Highest education level: Not on file  Occupational History  . Not on file  Social Needs  . Financial resource strain: Not on file  . Food insecurity:    Worry: Not on file    Inability: Not on file  . Transportation needs:    Medical: Not on file    Non-medical: Not  on file  Tobacco Use  . Smoking status: Never Smoker  . Smokeless tobacco: Never Used  Substance and Sexual Activity  . Alcohol use: Never    Frequency: Never  . Drug use: Never  . Sexual activity: Not on file  Lifestyle  . Physical activity:    Days per week: Not on file    Minutes per session: Not on file  . Stress: Not on file  Relationships  . Social connections:    Talks on phone: Not on file      Gets together: Not on file    Attends religious service: Not on file    Active member of club or organization: Not on file    Attends meetings of clubs or organizations: Not on file    Relationship status: Not on file  Other Topics Concern  . Not on file  Social History Narrative  . Not on file    Review of Systems: A 12 point ROS discussed and pertinent positives are indicated in the HPI above.  All other systems are negative.  Review of Systems  Constitutional: Positive for activity change and fatigue. Negative for fever and unexpected weight change.  HENT: Negative for trouble swallowing.   Eyes: Negative for visual disturbance.  Respiratory: Positive for shortness of breath. Negative for cough.   Cardiovascular: Positive for chest pain.  Gastrointestinal: Negative for nausea and vomiting.  Neurological: Negative for weakness.  Psychiatric/Behavioral: Negative for behavioral problems and confusion.    Vital Signs: BP 119/76 (BP Location: Left Arm)   Pulse 83   Temp 98.5 F (36.9 C) (Oral)   Resp 16   Ht 5\' 9"  (1.753 m)   Wt 220 lb (99.8 kg)   LMP 08/07/2017   SpO2 100%   BMI 32.49 kg/m   Physical Exam  Constitutional: She is oriented to person, place, and time. She appears well-nourished.  HENT:  Head: Atraumatic.  Eyes: EOM are normal.  Cardiovascular: Normal rate and regular rhythm.  Pulmonary/Chest: Effort normal and breath sounds normal.  Abdominal: Soft. Bowel sounds are normal.  Musculoskeletal: Normal range of motion.       Right lower leg: She exhibits no edema.       Left lower leg: She exhibits no edema.  Neurological: She is oriented to person, place, and time.  Skin: Skin is warm and dry.  Psychiatric: She has a normal mood and affect. Her behavior is normal.  Nursing note and vitals reviewed.   Imaging: Dg Chest 2 View  Result Date: 08/10/2017 CLINICAL DATA:  Left-sided chest pain.  Shortness of breath. EXAM: CHEST - 2 VIEW COMPARISON:   None. FINDINGS: Blunting of the left costophrenic angle is best seen on the AP view suggesting a small effusion or pleural thickening. The heart, hila, and mediastinum are normal. No pneumothorax. No nodules or masses. IMPRESSION: Small left pleural effusion versus pleural thickening. No other acute abnormalities. Electronically Signed   By: Gerome Samavid  Williams III M.D   On: 08/10/2017 16:09   Ct Angio Chest Pe W And/or Wo Contrast  Result Date: 08/10/2017 CLINICAL DATA:  Chest pain since November, worse with deep inspiration and progressing over the past several weeks. EXAM: CT ANGIOGRAPHY CHEST WITH CONTRAST TECHNIQUE: Multidetector CT imaging of the chest was performed using the standard protocol during bolus administration of intravenous contrast. Multiplanar CT image reconstructions and MIPs were obtained to evaluate the vascular anatomy. CONTRAST:  60 cc ISOVUE-370 IOPAMIDOL (ISOVUE-370) INJECTION 76% COMPARISON:  Same day CXR FINDINGS:  Cardiovascular: The study is of quality for the evaluation of pulmonary embolism. Bilateral pulmonary emboli are identified to both lower lobes at the origins of the lobar arteries. RV/LV ratio 0.69. No pericardial effusion or thickening. Heart size is normal. No aortic aneurysm or dissection. Mediastinum/Nodes: No discrete thyroid nodules. Unremarkable esophagus. No pathologically enlarged axillary, mediastinal or hilar lymph nodes. Lungs/Pleura: No pneumothorax. No pleural effusion. Pleural-based opacities in the lingula and left lower lobe compatible with stigmata of pulmonary infarcts or atelectasis. There is some dependent atelectasis bilaterally. No effusion or pneumothorax. Upper abdomen: Unremarkable. Musculoskeletal:  No aggressive appearing focal osseous lesions. Review of the MIP images confirms the above findings. IMPRESSION: 1. Positive for acute PE to both lower lobes within the proximal lobar arteries and borderline right heart strain (RV/LV Ratio = 0.69. 2.  Pleural-based lingular and left lower lobe opacities some which are triangular in shape raise suspicion for stigmata of pulmonary infarcts and/or atelectasis. These results were called by telephone at the time of interpretation on 08/10/2017 at 7:42 pm to PA Burna Forts, who verbally acknowledged these results. ) Electronically Signed   By: Tollie Eth M.D.   On: 08/10/2017 19:45    Labs:  CBC: Recent Labs    08/10/17 1538 08/11/17 0237  WBC 8.9 7.5  HGB 10.1* 9.9*  HCT 34.1* 33.6*  PLT 391 352    COAGS: Recent Labs    08/11/17 1150  INR 1.18    BMP: Recent Labs    08/10/17 1538 08/11/17 0237  NA 136 137  K 4.3 4.0  CL 107 106  CO2 18* 21*  GLUCOSE 87 136*  BUN 11 8  CALCIUM 9.3 9.1  CREATININE 0.86 0.84  GFRNONAA >60 >60  GFRAA >60 >60    LIVER FUNCTION TESTS: No results for input(s): BILITOT, AST, ALT, ALKPHOS, PROT, ALBUMIN in the last 8760 hours.  TUMOR MARKERS: No results for input(s): AFPTM, CEA, CA199, CHROMGRNA in the last 8760 hours.  Assessment and Plan:  Worsening SOB; Left CP B PE per imaging Pt is in NAD in room 100% RA RV/LV ratio stable; Troponin stable Dr Bonnielee Haff has discussed with CCM Will hold on Ekos lysis for now If change in symptoms -- pt worsens Please call IR  I have spoken to Pt and family about Ekos Thrombolysis-- answered all questions to satisfaction In case would need this procedure   Thank you for this interesting consult.  I greatly enjoyed meeting Farhiya Lucien and look forward to participating in their care.  A copy of this report was sent to the requesting provider on this date.  Electronically Signed: Robet Leu, PA-C 08/11/2017, 1:48 PM   I spent a total of 40 Minutes    in face to face in clinical consultation, greater than 50% of which was counseling/coordinating care for PE Ekos Thrombolysis

## 2017-08-12 LAB — BASIC METABOLIC PANEL WITH GFR
Anion gap: 9 (ref 5–15)
BUN: 9 mg/dL (ref 6–20)
CO2: 21 mmol/L — ABNORMAL LOW (ref 22–32)
Calcium: 9.1 mg/dL (ref 8.9–10.3)
Chloride: 106 mmol/L (ref 101–111)
Creatinine, Ser: 0.88 mg/dL (ref 0.44–1.00)
GFR calc Af Amer: >60 mL/min
GFR calc non Af Amer: >60 mL/min
Glucose, Bld: 96 mg/dL (ref 65–99)
Potassium: 4.4 mmol/L (ref 3.5–5.1)
Sodium: 136 mmol/L (ref 135–145)

## 2017-08-12 LAB — MRSA PCR SCREENING: MRSA BY PCR: NEGATIVE

## 2017-08-12 LAB — CBC
HCT: 34.5 % — ABNORMAL LOW (ref 36.0–46.0)
Hemoglobin: 10 g/dL — ABNORMAL LOW (ref 12.0–15.0)
MCH: 21.4 pg — ABNORMAL LOW (ref 26.0–34.0)
MCHC: 29 g/dL — ABNORMAL LOW (ref 30.0–36.0)
MCV: 73.9 fL — ABNORMAL LOW (ref 78.0–100.0)
Platelets: 375 10*3/uL (ref 150–400)
RBC: 4.67 MIL/uL (ref 3.87–5.11)
RDW: 17.7 % — ABNORMAL HIGH (ref 11.5–15.5)
WBC: 7.2 10*3/uL (ref 4.0–10.5)

## 2017-08-12 LAB — VITAMIN B12: Vitamin B-12: 249 pg/mL (ref 180–914)

## 2017-08-12 LAB — IRON AND TIBC
Iron: 23 ug/dL — ABNORMAL LOW (ref 28–170)
Saturation Ratios: 5 % — ABNORMAL LOW (ref 10.4–31.8)
TIBC: 421 ug/dL (ref 250–450)
UIBC: 398 ug/dL

## 2017-08-12 LAB — BRAIN NATRIURETIC PEPTIDE: B Natriuretic Peptide: 7 pg/mL (ref 0.0–100.0)

## 2017-08-12 LAB — FERRITIN: Ferritin: 8 ng/mL — ABNORMAL LOW (ref 11–307)

## 2017-08-12 LAB — HEPARIN LEVEL (UNFRACTIONATED): Heparin Unfractionated: 0.56 [IU]/mL (ref 0.30–0.70)

## 2017-08-12 LAB — TROPONIN I: Troponin I: <0.03 ng/mL

## 2017-08-12 MED ORDER — FERROUS SULFATE 325 (65 FE) MG PO TABS
325.0000 mg | ORAL_TABLET | Freq: Two times a day (BID) | ORAL | Status: DC
  Administered 2017-08-12 – 2017-08-13 (×2): 325 mg via ORAL
  Filled 2017-08-12 (×2): qty 1

## 2017-08-12 NOTE — Progress Notes (Signed)
PROGRESS NOTE    [name]  ZOX:[id number] DOB: 1995-05-30 DOA: 08/10/2017 PCP: System, Pcp Not In   Brief Narrative:  22 year old female with history of asthma microcytic anemia came to the hospital with complaints of left-sided pleuritic chest pain and exertional shortness of breath which had been present for the past 5 months.  Stated her shortness of breath suddenly had worsened over the last few days therefore seek medical attention.  She has been taking oral contraceptives for her ovarian cyst.  Upon admission she was noted to have acute bilateral pulmonary embolism with right heart strain.  Echocardiogram suggested possible ventricular thrombus.  She was started on heparin drip and monitored in the ICU overnight.  Case was discussed by the intensivist with the interventional radiology position who did not think ECOS was necessary at this time as patient was hemodynamically stable.  Patient was eventually transition out on heparin drip.   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  Unable to get hypercoagulable work-up as patient is already on this.  Eventually we will switch this to normal agent in the next 24-48 hours. - Echocardiogram shows ejection fraction 45-50% with diffuse hypokinesis, elevated PA pressures with moderate pulmonary hypertension.  Possible concerns of right ventricular large density-thrombus - She will need ambulatory pulse ox.  Have spoken with Dr. Clelia Croft over the phone from hematology, who will follow up with the patient in his clinic.  Inbox message sent as well. -Provide supportive care. -Appreciate pulmonary input  Symptomatic anemia, iron deficiency anemia Microcytic anemia - Patient is severely iron deficient, will start on iron supplements.   Bowel regimen as needed.  I have informed the patient that this can turn her stool dark.  Ovarian cyst -Used to be an oral contraceptive.  Would advise against using this and follow-up with outpatient GYN.  History of asthma -Continue bronchodilators as ordered   DVT prophylaxis: Currently on heparin drip Code Status: Full code Family Communication: Mother at bedside Disposition Plan: Continue to monitor in the hospital at least next 24-48 hours.  Consultants:   Pulmonary  Interventional radiology  [facility], Dr. Clelia Croft    Subjective: No complaints this morning.  Feels a little better at rest compared to yesterday.  Review of Systems Otherwise negative except as per HPI, including: HEENT/EYES = negative for pain, redness, loss of vision, double vision, blurred vision, loss of hearing, sore throat, hoarseness, dysphagia Cardiovascular= negative for chest pain, palpitation, murmurs, lower extremity swelling Respiratory/lungs= negative for  cough, hemoptysis, wheezing, mucus production Gastrointestinal= negative for nausea, vomiting,, abdominal pain, melena, hematemesis Genitourinary= negative for Dysuria, Hematuria, Change in Urinary Frequency MSK = Negative for arthralgia, myalgias, Back Pain, Joint swelling  Neurology= Negative for headache, seizures, numbness, tingling  Psychiatry= Negative for anxiety, depression, suicidal and homocidal ideation Allergy/Immunology= Medication/Food allergy as listed  Skin= Negative for Rash, lesions, ulcers, itching   Objective: Vitals:   08/11/17 1631 08/11/17 2224 08/11/17 2334 08/12/17 0721  BP: 118/83  119/85 131/77  Pulse: 89  87 79  Resp: 18  18 18   Temp: 99 F (37.2 C)  98 F (36.7 C) 97.8 F (36.6 C)  TempSrc: Oral  Oral Oral  SpO2: 100% 97% 97% 96%  Weight:      Height:        Intake/Output  Summary (Last 24 hours) at 08/12/2017 1113 Last data filed at 08/12/2017 0800 Gross per 24 hour  Intake 1267.82 ml    Output 1100 ml  Net 167.82 ml   Filed Weights   08/10/17 1518  Weight: 99.8 kg (220 lb)    Examination:  General exam: Appears calm and comfortable  Respiratory system: Clear to auscultation. Respiratory effort normal. Cardiovascular system: S1 & S2 heard, RRR. No JVD, murmurs, rubs, gallops or clicks. No pedal edema. Gastrointestinal system: Abdomen is nondistended, soft and nontender. No organomegaly or masses felt. Normal bowel sounds heard. Central nervous system: Alert and oriented. No focal neurological deficits. Extremities: Symmetric 5 x 5 power. Skin: No rashes, lesions or ulcers Psychiatry: Judgement and insight appear normal. Mood & affect appropriate.     Data Reviewed:   CBC: Recent Labs  Lab 08/10/17 1538 08/11/17 0237 08/12/17 0421  WBC 8.9 7.5 7.2  HGB 10.1* 9.9* 10.0*  HCT 34.1* 33.6* 34.5*  MCV 72.2* 72.3* 73.9*  PLT 391 352 375   Basic Metabolic Panel: Recent Labs  Lab 08/10/17 1538 08/11/17 0237 08/12/17 0421  NA 136 137 136  K 4.3 4.0 4.4  CL 107 106 106  CO2 18* 21* 21*  GLUCOSE 87 136* 96  BUN 11 8 9   CREATININE 0.86 0.84 0.88  CALCIUM 9.3 9.1 9.1   GFR: Estimated Creatinine Clearance: 127.1 mL/min (by C-G formula based on SCr of 0.88 mg/dL). Liver Function Tests: No results for input(s): AST, ALT, ALKPHOS, BILITOT, PROT, ALBUMIN in the last 168 hours. No results for input(s): LIPASE, AMYLASE in the last 168 hours. No results for input(s): AMMONIA in the last 168 hours. Coagulation Profile: Recent Labs  Lab 08/11/17 1150  INR 1.18   Cardiac Enzymes: Recent Labs  Lab 08/10/17 2105 08/11/17 0237 08/11/17 1017 08/12/17 0421  TROPONINI <0.03 <0.03 <0.03 <0.03   BNP (last 3 results) No results for input(s): PROBNP in the last 8760 hours. HbA1C: No results for input(s): HGBA1C in the last 72 hours. CBG: No results for input(s): GLUCAP in the last 168 hours. Lipid Profile: No results for input(s): CHOL, HDL, LDLCALC,  TRIG, CHOLHDL, LDLDIRECT in the last 72 hours. Thyroid Function Tests: No results for input(s): TSH, T4TOTAL, FREET4, T3FREE, THYROIDAB in the last 72 hours. Anemia Panel: Recent Labs    08/10/17 2105 08/12/17 0758  VITAMINB12 251 249  FOLATE 19.6  --   FERRITIN 1* 8*  TIBC 459* 421  IRON 21* 23*  RETICCTPCT 1.3  --    Sepsis Labs: No results for input(s): PROCALCITON, LATICACIDVEN in the last 168 hours.  No results found for this or any previous visit (from the past 240 hour(s)).       Radiology Studies: Dg Chest 2 View  Result Date: 08/10/2017 CLINICAL DATA:  Left-sided chest pain.  Shortness of breath. EXAM: CHEST - 2 VIEW COMPARISON:  None. FINDINGS: Blunting of the left costophrenic angle is best seen on the AP view suggesting a small effusion or pleural thickening. The heart, hila, and mediastinum are normal. No pneumothorax. No nodules or masses. IMPRESSION: Small left pleural effusion versus pleural thickening. No other acute abnormalities. Electronically Signed   By: Gerome Sam III M.D   On: 08/10/2017 16:09   Ct Angio Chest Pe W And/or Wo Contrast  Result Date: 08/10/2017 CLINICAL DATA:  Chest pain since November, worse with deep inspiration and progressing over the past several weeks. EXAM: CT ANGIOGRAPHY CHEST WITH CONTRAST TECHNIQUE: Multidetector CT imaging of the  chest was performed using the standard protocol during bolus administration of intravenous contrast. Multiplanar CT image reconstructions and MIPs were obtained to evaluate the vascular anatomy. CONTRAST:  60 cc ISOVUE-370 IOPAMIDOL (ISOVUE-370) INJECTION 76% COMPARISON:  Same day CXR FINDINGS: Cardiovascular: The study is of quality for the evaluation of pulmonary embolism. Bilateral pulmonary emboli are identified to both lower lobes at the origins of the lobar arteries. RV/LV ratio 0.69. No pericardial effusion or thickening. Heart size is normal. No aortic aneurysm or dissection. Mediastinum/Nodes: No  discrete thyroid nodules. Unremarkable esophagus. No pathologically enlarged axillary, mediastinal or hilar lymph nodes. Lungs/Pleura: No pneumothorax. No pleural effusion. Pleural-based opacities in the lingula and left lower lobe compatible with stigmata of pulmonary infarcts or atelectasis. There is some dependent atelectasis bilaterally. No effusion or pneumothorax. Upper abdomen: Unremarkable. Musculoskeletal:  No aggressive appearing focal osseous lesions. Review of the MIP images confirms the above findings. IMPRESSION: 1. Positive for acute PE to both lower lobes within the proximal lobar arteries and borderline right heart strain (RV/LV Ratio = 0.69. 2. Pleural-based lingular and left lower lobe opacities some which are triangular in shape raise suspicion for stigmata of pulmonary infarcts and/or atelectasis. These results were called by telephone at the time of interpretation on 08/10/2017 at 7:42 pm to PA Burna FortsJeff Hedges, who verbally acknowledged these results. ) Electronically Signed   By: Tollie Ethavid  Kwon M.D.   On: 08/10/2017 19:45        Scheduled Meds: . budesonide (PULMICORT) nebulizer solution  0.25 mg Nebulization BID  . montelukast  10 mg Oral QHS  . sodium chloride flush  3 mL Intravenous Q12H  . sodium chloride flush  3 mL Intravenous Q12H   Continuous Infusions: . sodium chloride    . heparin 1,300 Units/hr (08/12/17 0052)     LOS: 1 day    Time spent: 45 mins    Ankit Joline Maxcyhirag Amin, MD [facility] Pager [phone]   If 7PM-7AM, please contact night-coverage [email or web address] Password [email or web address] 08/12/2017, 11:13 AM

## 2017-08-12 NOTE — Progress Notes (Signed)
#   4. S/W MONICA @ [employer] [facility] # [phone]   1. XARELTO  15 MG BID 21 DAYS  COVER- YES  CO-PAY- $ 55.00  TIER- 2 DRUG  PRIOR APPROVAL- NO   2. XARELTO 20 MG DAILY  COVER- YES  CO-PAY- $ 55.00  TIER- 2 DRUG  PRIOR APPROVAL- NO    3. ELIQUIS 5 MG BID  COVER- YES  CO-PAY- $ 55.00  TIER- 2 DRUG  PRIOR APPROVAL- NO    4. ELIQUIS 2.5 MG BID  COVER- YES  CO-PAY- $ 55.00  TIER- 2 DRUG  PRIOR APPROVAL- NO

## 2017-08-12 NOTE — Progress Notes (Signed)
ANTICOAGULATION CONSULT NOTE  Pharmacy Consult for Heparin Indication: pulmonary embolus  Allergies  Allergen Reactions  . Apple Diarrhea, Itching, Nausea Only and Other (See Comments)    Sore stomach and throat, per mom (NO RAW APPLES!!)  . Carrot [Daucus Carota] Diarrhea and Other (See Comments)    Sore throat and stomach, also (NO RAW CARROTS!!)  . Flovent Hfa [Fluticasone] Nausea Only and Other (See Comments)    Made chest hurt also  . Peanut Oil Other (See Comments)    Throat will become sore and stomach hurts (No hives or trouble breathing)  . Adhesive [Tape] Rash    Band-Aids   Patient Measurements: Height: 5\' 9"  (175.3 cm) Weight: 220 lb (99.8 kg) IBW/kg (Calculated) : 66.2 Heparin Dosing Weight: 87.9 kg  Vital Signs: Temp: 97.8 F (36.6 C) (04/25 0721) Temp Source: Oral (04/25 0721) BP: 131/77 (04/25 0721) Pulse Rate: 79 (04/25 0721)  Labs: Recent Labs    08/10/17 1538  08/11/17 0237 08/11/17 1017 08/11/17 1150 08/11/17 1940 08/12/17 0421  HGB 10.1*  --  9.9*  --   --   --  10.0*  HCT 34.1*  --  33.6*  --   --   --  34.5*  PLT 391  --  352  --   --   --  375  LABPROT  --   --   --   --  14.9  --   --   INR  --   --   --   --  1.18  --   --   HEPARINUNFRC  --    < > 0.83* 0.72*  --  0.46 0.56  CREATININE 0.86  --  0.84  --   --   --  0.88  TROPONINI  --    < > <0.03 <0.03  --   --  <0.03   < > = values in this interval not displayed.   Estimated Creatinine Clearance: 127.1 mL/min (by C-G formula based on SCr of 0.88 mg/dL).  Medical History: Past Medical History:  Diagnosis Date  . Asthma   . Eczema    Assessment: 22 yo female presented ED on 4/23 with chest pain and SOB. Patient is taking OCPs. CT shows acute PE to both lower lobes within the proximal lobar arteries. Pharmacy on board for heparin dosing.   Heparin level this morning remains therapeutic (HL 0.56 << 0.46, goal of 0.3-0.7). Hgb/Hct trending up slightly, plts wnl. No overt s/sx of  bleeding noted.   Goal of Therapy:  Heparin level 0.3-0.7 units/ml Monitor platelets by anticoagulation protocol: Yes   Plan:  1. Continue Heparin at 1300 units/hr (13 ml/hr) 2. Will continue to monitor for any signs/symptoms of bleeding and will follow up with heparin level in the a.m.   Thank you for allowing pharmacy to be a part of this patient's care.  Georgina PillionElizabeth Teretha Chalupa, PharmD, BCPS Clinical Pharmacist Pager: [phone] Clinical phone for 08/12/2017 from 7a-3:30p: [phone] If after 3:30p, please call main pharmacy at: [phone] 08/12/2017 7:55 AM

## 2017-08-12 NOTE — Progress Notes (Signed)
Name: [name] MRN: [id number] DOB: September 24, 1995    ADMISSION DATE:  08/10/2017 CONSULTATION DATE: 08/11/2017  REFERRING MD : [facility]  CHIEF COMPLAINT: Back and chest wall pain  BRIEF PATIENT DESCRIPTION: Well-nourished well-developed 22 year old female  SIGNIFICANT EVENTS  08/11/2017 evaluation for possible mucous  STUDIES:   CT of the chest was noted 2D echo was noted   HISTORY OF PRESENT ILLNESS:   22 year old F American female who reports back and chest pain intermittently with on a pain scale of 2-8 (November 2018.  She does have pain got worse over the last 24-48 hours and she presented to the hospital had a CT scan of the chest revealed bilateral lower lobe pulmonary embolism.  Subsequently she had a 2D echo performed on 08/10/2017 which showed severe right ventricular dysfunction, enlarged and sooner RV concerning for thrombus mild tricuspid valve regurgitation and moderate pulmonary hypertension.  Pulmonary critical care was consult it on 08/11/2017 for questionable EKOS.  She does meet the criteria for a excess.  Interventional radiology will be contacted for further evaluation and treatment. She has a past medical history significant for asthma.  Does take oral contraceptives for management of ovarian cyst.  Denies any recent trauma to her legs.  No recent travel that coincides with the starting of her symptoms.  Normally healthy and in no acute distress.  She does have a history of a great aunt have a pulmonary embolism in the past.  She currently is on heparin drip which precludes most of the workup for clotting disorders.  These can be's considered in the near future.  08/12/17: Did well overnight  No major issue BP and Troponin remained ok DVT study is negative   SUBJECTIVE:   VITAL SIGNS: Temp:  [97.8 F (36.6 C)-99 F (37.2 C)] 97.8 F (36.6 C) (04/25 0721) Pulse Rate:  [79-89] 79 (04/25 0721) Resp:  [18] 18 (04/25 0721) BP: (118-131)/(77-85) 131/77 (04/25  0721) SpO2:  [96 %-100 %] 100 % (04/25 1139)  PHYSICAL EXAMINATION: General: Well-nourished well-developed female no acute distress Neuro: Neurologically intact HEENT: No JVD or lymphadenopathy is appreciated Cardiovascular: Heart sounds are regular in rate and rhythm Lungs: Clear to auscultation without wheeze Abdomen: Obese soft nontender Musculoskeletal: Lower extremities are intact no evidence of trauma Skin: Warm and dry  Recent Labs  Lab 08/10/17 1538 08/11/17 0237 08/12/17 0421  NA 136 137 136  K 4.3 4.0 4.4  CL 107 106 106  CO2 18* 21* 21*  BUN 11 8 9   CREATININE 0.86 0.84 0.88  GLUCOSE 87 136* 96   Recent Labs  Lab 08/10/17 1538 08/11/17 0237 08/12/17 0421  HGB 10.1* 9.9* 10.0*  HCT 34.1* 33.6* 34.5*  WBC 8.9 7.5 7.2  PLT 391 352 375   Dg Chest 2 View  Result Date: 08/10/2017 CLINICAL DATA:  Left-sided chest pain.  Shortness of breath. EXAM: CHEST - 2 VIEW COMPARISON:  None. FINDINGS: Blunting of the left costophrenic angle is best seen on the AP view suggesting a small effusion or pleural thickening. The heart, hila, and mediastinum are normal. No pneumothorax. No nodules or masses. IMPRESSION: Small left pleural effusion versus pleural thickening. No other acute abnormalities. Electronically Signed   By: Gerome Samavid  Williams III M.D   On: 08/10/2017 16:09   Ct Angio Chest Pe W And/or Wo Contrast  Result Date: 08/10/2017 CLINICAL DATA:  Chest pain since November, worse with deep inspiration and progressing over the past several weeks. EXAM: CT ANGIOGRAPHY CHEST WITH CONTRAST TECHNIQUE: Multidetector  CT imaging of the chest was performed using the standard protocol during bolus administration of intravenous contrast. Multiplanar CT image reconstructions and MIPs were obtained to evaluate the vascular anatomy. CONTRAST:  60 cc ISOVUE-370 IOPAMIDOL (ISOVUE-370) INJECTION 76% COMPARISON:  Same day CXR FINDINGS: Cardiovascular: The study is of quality for the evaluation of  pulmonary embolism. Bilateral pulmonary emboli are identified to both lower lobes at the origins of the lobar arteries. RV/LV ratio 0.69. No pericardial effusion or thickening. Heart size is normal. No aortic aneurysm or dissection. Mediastinum/Nodes: No discrete thyroid nodules. Unremarkable esophagus. No pathologically enlarged axillary, mediastinal or hilar lymph nodes. Lungs/Pleura: No pneumothorax. No pleural effusion. Pleural-based opacities in the lingula and left lower lobe compatible with stigmata of pulmonary infarcts or atelectasis. There is some dependent atelectasis bilaterally. No effusion or pneumothorax. Upper abdomen: Unremarkable. Musculoskeletal:  No aggressive appearing focal osseous lesions. Review of the MIP images confirms the above findings. IMPRESSION: 1. Positive for acute PE to both lower lobes within the proximal lobar arteries and borderline right heart strain (RV/LV Ratio = 0.69. 2. Pleural-based lingular and left lower lobe opacities some which are triangular in shape raise suspicion for stigmata of pulmonary infarcts and/or atelectasis. These results were called by telephone at the time of interpretation on 08/10/2017 at 7:42 pm to PA Burna Forts, who verbally acknowledged these results. ) Electronically Signed   By: Tollie Eth M.D.   On: 08/10/2017 19:45    ASSESSMENT: Principal Problem:   Acute pulmonary embolism (HCC)- Mild global reduction in LV systolic function; mild RVE with   severe RV dysfunction; large density in RV concerning for   thrombus; mild TR with moderate pulmonary hypertension   Asthma   Microcytic anemia   Ovarian cyst   Discussion: 22 year old F American female who reports back and chest pain intermittently with on a pain scale of 2-8 (November 2018.  She does have pain got worse over the last 24-48 hours and she presented to the hospital had a CT scan of the chest revealed bilateral lower lobe pulmonary embolism.  Subsequently she had a 2D echo  performed on 08/10/2017 which showed severe right ventricular dysfunction, enlarged and sooner RV concerning for thrombus mild tricuspid valve regurgitation and moderate pulmonary hypertension.  Pulmonary critical care was consult it on 08/11/2017 for questionable EKOS.  She does meet the criteria for a excess.  Interventional radiology will be contacted for further evaluation and treatment. She has a past medical history significant for asthma.  Does take oral contraceptives for management of ovarian cyst.  Denies any recent trauma to her legs.  No recent travel that coincides with the starting of her symptoms.  Normally healthy and in no acute distress.  She does have a history of a great aunt have a pulmonary embolism in the past.  She currently is on heparin drip which precludes most of the workup for clotting disorders.  These can be's considered in the near future.  Assement stable PE with significant clot burden.  Soft case for sub massive as Troponin is negative, DVT study is negative, Some RV strain reported on Echo but hemodynamic stability and HR is ok  Plan: Continue heparin DVT study is negative Plan for NOVA from tomorrow if continue to remain stable and post 48 hr window of thrombolysis Plan for out patient follow up with hematology noted If signs of hemodynamic instability or RV strain will benefit from Ecos or systemic thrombolysis at that point Case discussed with primary team and  family Consider hypercoagulable work up<plan for outpatient noted> IF issues getting approved for NOVA than Coumadin can be started tonight Will follow along Thank you for consult

## 2017-08-13 ENCOUNTER — Telehealth: Payer: Self-pay | Admitting: Oncology

## 2017-08-13 DIAGNOSIS — I2699 Other pulmonary embolism without acute cor pulmonale: Secondary | ICD-10-CM

## 2017-08-13 LAB — FOLATE RBC
FOLATE, HEMOLYSATE: 385.8 ng/mL
FOLATE, RBC: 1122 ng/mL (ref >498)
HEMATOCRIT: 34.4 % (ref 34.0–46.6)

## 2017-08-13 LAB — CBC
HCT: 34.7 % — ABNORMAL LOW (ref 36.0–46.0)
Hemoglobin: 10 g/dL — ABNORMAL LOW (ref 12.0–15.0)
MCH: 21 pg — AB (ref 26.0–34.0)
MCHC: 28.8 g/dL — ABNORMAL LOW (ref 30.0–36.0)
MCV: 72.9 fL — AB (ref 78.0–100.0)
PLATELETS: 348 10*3/uL (ref 150–400)
RBC: 4.76 MIL/uL (ref 3.87–5.11)
RDW: 17.1 % — ABNORMAL HIGH (ref 11.5–15.5)
WBC: 7.6 10*3/uL (ref 4.0–10.5)

## 2017-08-13 LAB — BASIC METABOLIC PANEL
Anion gap: 9 (ref 5–15)
BUN: 8 mg/dL (ref 6–20)
CHLORIDE: 105 mmol/L (ref 101–111)
CO2: 22 mmol/L (ref 22–32)
CREATININE: 0.89 mg/dL (ref 0.44–1.00)
Calcium: 9.4 mg/dL (ref 8.9–10.3)
GFR calc Af Amer: >60 mL/min (ref >60)
GLUCOSE: 94 mg/dL (ref 65–99)
Potassium: 4.7 mmol/L (ref 3.5–5.1)
SODIUM: 136 mmol/L (ref 135–145)

## 2017-08-13 LAB — HEPARIN LEVEL (UNFRACTIONATED): Heparin Unfractionated: 0.69 IU/mL (ref 0.30–0.70)

## 2017-08-13 LAB — MAGNESIUM: MAGNESIUM: 1.7 mg/dL (ref 1.7–2.4)

## 2017-08-13 MED ORDER — RIVAROXABAN 15 MG PO TABS
15.0000 mg | ORAL_TABLET | Freq: Two times a day (BID) | ORAL | Status: DC
  Administered 2017-08-13: 15 mg via ORAL
  Filled 2017-08-13: qty 1

## 2017-08-13 MED ORDER — BISACODYL 5 MG PO TBEC: 5.0000 mg | DELAYED_RELEASE_TABLET | Freq: Every day | ORAL | 0 refills | Status: AC

## 2017-08-13 MED ORDER — RIVAROXABAN (XARELTO) VTE STARTER PACK (15 & 20 MG): ORAL_TABLET | ORAL | 0 refills | Status: DC

## 2017-08-13 MED ORDER — FERROUS SULFATE 325 (65 FE) MG PO TABS: 325.0000 mg | ORAL_TABLET | Freq: Two times a day (BID) | ORAL | 0 refills | Status: AC

## 2017-08-13 MED ORDER — RIVAROXABAN 20 MG PO TABS: 20.0000 mg | ORAL_TABLET | Freq: Every day | ORAL | Status: DC

## 2017-08-13 NOTE — Discharge Summary (Signed)
Physician Discharge Summary  Briscoe DeutscherAngelica Schuessler UJW:[id number] DOB: 23-May-1995 DOA: 08/10/2017  PCP: System, Pcp Not In  Admit date: 08/10/2017 Discharge date: 08/13/2017  Admitted From: Home Disposition:  Home  Recommendations for Outpatient Follow-up:  1. Follow up with PCP in 1 week 2. Follow up with Dr. Clelia CroftShadad (Hematology) on 5/21 at 2pm  3. Stop oral contraceptives. Follow up with Gyn regarding ovarian cyst treatment.   Discharge Condition: Stable CODE STATUS: Full  Diet recommendation: Regular diet   Brief/Interim Summary: [name] is a 22 year old female with history of asthma, microcytic anemia, ovarian cyst on OCP who came to the hospital with complaints of left-sided pleuritic chest pain and exertional shortness of breath which had been present for the past 5 months.  Stated her shortness of breath suddenly had worsened over the last few days therefore seek medical attention.  She has been taking oral contraceptives for her ovarian cyst.  Upon admission she was noted to have acute bilateral pulmonary embolism with right heart strain.  Echocardiogram suggested possible ventricular thrombus.  She was started on heparin drip and monitored in the ICU overnight.  Case was discussed by the intensivist with the interventional radiology position who did not think ECOS was necessary at this time as patient was hemodynamically stable.  Patient was eventually transition out on heparin drip and transitioned to Xarelto. She remained hemodynamically stable. On day of discharge, she was on room air without acute complaints.  Discharge Diagnoses:  Principal Problem:   Acute pulmonary embolism (HCC) Active Problems:   Asthma   Microcytic anemia  Acute respiratory distress secondary to acute bilateral pulmonary embolism with cor pulmonale - Evaluated by PCCM during hospitalization  - Treated with IV heparin --> Xarelto on discharge  - Echocardiogram shows ejection fraction 45-50% with diffuse  hypokinesis, elevated PA pressures with moderate pulmonary hypertension.  Possible concerns of right ventricular large density thrombus - Set up appointment to follow up with Dr. Clelia CroftShadad (Hematology) as outpatient  Symptomatic anemia, iron deficiency anemia, microcytic anemia - Start iron supplementation   Ovarian cyst - Stop OCP. Follow up with Gyn for other treatment option   History of asthma - Continue bronchodilators as ordered   Discharge Instructions  Discharge Instructions    Call MD for:  difficulty breathing, headache or visual disturbances   Complete by:  As directed    Call MD for:  extreme fatigue   Complete by:  As directed    Call MD for:  hives   Complete by:  As directed    Call MD for:  persistant dizziness or light-headedness   Complete by:  As directed    Call MD for:  persistant nausea and vomiting   Complete by:  As directed    Call MD for:  severe uncontrolled pain   Complete by:  As directed    Call MD for:  temperature >100.4   Complete by:  As directed    Diet general   Complete by:  As directed    Discharge instructions   Complete by:  As directed    You were cared for by a hospitalist during your hospital stay. If you have any questions about your discharge medications or the care you received while you were in the hospital after you are discharged, you can call the unit and asked to speak with the hospitalist on call if the hospitalist that took care of you is not available. Once you are discharged, your primary care physician will handle any  further medical issues. Please note that NO REFILLS for any discharge medications will be authorized once you are discharged, as it is imperative that you return to your primary care physician (or establish a relationship with a primary care physician if you do not have one) for your aftercare needs so that they can reassess your need for medications and monitor your lab values.   Increase activity slowly    Complete by:  As directed      Allergies as of 08/13/2017      Reactions   Apple Diarrhea, Itching, Nausea Only, Other (See Comments)   Sore stomach and throat, per mom (NO RAW APPLES!!)   Carrot [daucus Carota] Diarrhea, Other (See Comments)   Sore throat and stomach, also (NO RAW CARROTS!!)   Flovent Hfa [fluticasone] Nausea Only, Other (See Comments)   Made chest hurt also   Peanut Oil Other (See Comments)   Throat will become sore and stomach hurts (No hives or trouble breathing)   Adhesive [tape] Rash   Band-Aids      Medication List    STOP taking these medications   norethindrone-ethinyl estradiol 1-20 MG-MCG tablet Commonly known as:  JUNEL FE,GILDESS FE,LOESTRIN FE     TAKE these medications   ADVAIR DISKUS 250-50 MCG/DOSE Aepb Generic drug:  Fluticasone-Salmeterol Inhale 1 puff into the lungs every 12 (twelve) hours.   albuterol (5 MG/ML) 0.5% nebulizer solution Commonly known as:  PROVENTIL Take 2.5 mg by nebulization every 6 (six) hours as needed for wheezing or shortness of breath.   bisacodyl 5 MG EC tablet Commonly known as:  DULCOLAX Take 1 tablet (5 mg total) by mouth daily.   ferrous sulfate 325 (65 FE) MG tablet Take 1 tablet (325 mg total) by mouth 2 (two) times daily with a meal.   fluticasone 50 MCG/ACT nasal spray Commonly known as:  FLONASE Place 1-2 sprays into both nostrils daily as needed (for seasonal allergies).   levalbuterol 45 MCG/ACT inhaler Commonly known as:  XOPENEX HFA Inhale 2 puffs into the lungs every 6 (six) hours as needed for wheezing or shortness of breath.   montelukast 10 MG tablet Commonly known as:  SINGULAIR Take 10 mg by mouth at bedtime.   Rivaroxaban 15 & 20 MG Tbpk Start with one 15mg  tablet by mouth twice a day with food. On Day 22, switch to one 20mg  tablet once a day with food.   triamcinolone cream 0.1 % Commonly known as:  KENALOG Apply 1 application topically 2 (two) times daily as needed (for  itching).      Follow-up Information    Benjiman Core, MD. Go on 09/07/2017.   Specialty:  Oncology Why:  at 2pm  Contact information: [address] [place] [address] [phone]        Your PCP. Schedule an appointment as soon as possible for a visit in 1 week(s).          Allergies  Allergen Reactions  . Apple Diarrhea, Itching, Nausea Only and Other (See Comments)    Sore stomach and throat, per mom (NO RAW APPLES!!)  . Carrot [Daucus Carota] Diarrhea and Other (See Comments)    Sore throat and stomach, also (NO RAW CARROTS!!)  . Flovent Hfa [Fluticasone] Nausea Only and Other (See Comments)    Made chest hurt also  . Peanut Oil Other (See Comments)    Throat will become sore and stomach hurts (No hives or trouble breathing)  . Adhesive [Tape] Rash  Band-Aids    Consultations:  PCCM  IR    Procedures/Studies: Dg Chest 2 View  Result Date: 08/10/2017 CLINICAL DATA:  Left-sided chest pain.  Shortness of breath. EXAM: CHEST - 2 VIEW COMPARISON:  None. FINDINGS: Blunting of the left costophrenic angle is best seen on the AP view suggesting a small effusion or pleural thickening. The heart, hila, and mediastinum are normal. No pneumothorax. No nodules or masses. IMPRESSION: Small left pleural effusion versus pleural thickening. No other acute abnormalities. Electronically Signed   By: Gerome Sam III M.D   On: 08/10/2017 16:09   Ct Angio Chest Pe W And/or Wo Contrast  Result Date: 08/10/2017 CLINICAL DATA:  Chest pain since November, worse with deep inspiration and progressing over the past several weeks. EXAM: CT ANGIOGRAPHY CHEST WITH CONTRAST TECHNIQUE: Multidetector CT imaging of the chest was performed using the standard protocol during bolus administration of intravenous contrast. Multiplanar CT image reconstructions and MIPs were obtained to evaluate the vascular anatomy. CONTRAST:  60 cc ISOVUE-370 IOPAMIDOL (ISOVUE-370) INJECTION 76%  COMPARISON:  Same day CXR FINDINGS: Cardiovascular: The study is of quality for the evaluation of pulmonary embolism. Bilateral pulmonary emboli are identified to both lower lobes at the origins of the lobar arteries. RV/LV ratio 0.69. No pericardial effusion or thickening. Heart size is normal. No aortic aneurysm or dissection. Mediastinum/Nodes: No discrete thyroid nodules. Unremarkable esophagus. No pathologically enlarged axillary, mediastinal or hilar lymph nodes. Lungs/Pleura: No pneumothorax. No pleural effusion. Pleural-based opacities in the lingula and left lower lobe compatible with stigmata of pulmonary infarcts or atelectasis. There is some dependent atelectasis bilaterally. No effusion or pneumothorax. Upper abdomen: Unremarkable. Musculoskeletal:  No aggressive appearing focal osseous lesions. Review of the MIP images confirms the above findings. IMPRESSION: 1. Positive for acute PE to both lower lobes within the proximal lobar arteries and borderline right heart strain (RV/LV Ratio = 0.69. 2. Pleural-based lingular and left lower lobe opacities some which are triangular in shape raise suspicion for stigmata of pulmonary infarcts and/or atelectasis. These results were called by telephone at the time of interpretation on 08/10/2017 at 7:42 pm to PA Burna Forts, who verbally acknowledged these results. ) Electronically Signed   By: Tollie Eth M.D.   On: 08/10/2017 19:45    Echo 4/24 Study Conclusions  - Left ventricle: The cavity size was normal. Wall thickness was   normal. Systolic function was mildly reduced. The estimated   ejection fraction was in the range of 45% to 50%. Diffuse   hypokinesis. Left ventricular diastolic function parameters were   normal. - Ventricular septum: The contour showed diastolic flattening and   systolic flattening. - Right ventricle: The cavity size was mildly dilated. Systolic   function was severely reduced. - Pulmonary arteries: Systolic pressure was  moderately increased.   PA peak pressure: 56 mm Hg (S).  Impressions:  - Mild global reduction in LV systolic function; mild RVE with   severe RV dysfunction; large density in RV concerning for   thrombus; mild TR with moderate pulmonary hypertension; results   relayed to Dr Benjamine Mola.    Discharge Exam: Vitals:   08/13/17 0801 08/13/17 0902  BP: 123/80   Pulse: 81   Resp: 16   Temp: 98.2 F (36.8 C)   SpO2: 100% 96%    General: Pt is alert, awake, not in acute distress Cardiovascular: RRR, S1/S2 +, no rubs, no gallops Respiratory: CTA bilaterally, no wheezing, no rhonchi Abdominal: Soft, NT, ND, bowel sounds + Extremities: no  edema, no cyanosis    The results of significant diagnostics from this hospitalization (including imaging, microbiology, ancillary and laboratory) are listed below for reference.     Microbiology: Recent Results (from the past 240 hour(s))  MRSA PCR Screening     Status: None   Collection Time: 08/12/17 10:05 PM  Result Value Ref Range Status   MRSA by PCR NEGATIVE NEGATIVE Final    Comment:        The GeneXpert MRSA Assay (FDA approved for NASAL specimens only), is one component of a comprehensive MRSA colonization surveillance program. It is not intended to diagnose MRSA infection nor to guide or monitor treatment for MRSA infections. Performed at [facility], [address], [place], [place] [address]      Labs: BNP (last 3 results) Recent Labs    08/12/17 0421  BNP 7.0   Basic Metabolic Panel: Recent Labs  Lab 08/10/17 1538 08/11/17 0237 08/12/17 0421 08/13/17 0322  NA 136 137 136 136  K 4.3 4.0 4.4 4.7  CL 107 106 106 105  CO2 18* 21* 21* 22  GLUCOSE 87 136* 96 94  BUN 11 8 9 8   CREATININE 0.86 0.84 0.88 0.89  CALCIUM 9.3 9.1 9.1 9.4  MG  --   --   --  1.7   Liver Function Tests: No results for input(s): AST, ALT, ALKPHOS, BILITOT, PROT, ALBUMIN in the last 168 hours. No results for input(s): LIPASE,  AMYLASE in the last 168 hours. No results for input(s): AMMONIA in the last 168 hours. CBC: Recent Labs  Lab 08/10/17 1538 08/11/17 0237 08/12/17 0421 08/13/17 0322  WBC 8.9 7.5 7.2 7.6  HGB 10.1* 9.9* 10.0* 10.0*  HCT 34.1* 33.6* 34.5* 34.7*  MCV 72.2* 72.3* 73.9* 72.9*  PLT 391 352 375 348   Cardiac Enzymes: Recent Labs  Lab 08/10/17 2105 08/11/17 0237 08/11/17 1017 08/12/17 0421  TROPONINI <0.03 <0.03 <0.03 <0.03   BNP: Invalid input(s): POCBNP CBG: No results for input(s): GLUCAP in the last 168 hours. D-Dimer Recent Labs    08/10/17 1538  DDIMER 1.77*   Hgb A1c No results for input(s): HGBA1C in the last 72 hours. Lipid Profile No results for input(s): CHOL, HDL, LDLCALC, TRIG, CHOLHDL, LDLDIRECT in the last 72 hours. Thyroid function studies No results for input(s): TSH, T4TOTAL, T3FREE, THYROIDAB in the last 72 hours.  Invalid input(s): FREET3 Anemia work up [employer]    08/10/17 2105 08/12/17 0758  VITAMINB12 251 249  FOLATE 19.6  --   FERRITIN 1* 8*  TIBC 459* 421  IRON 21* 23*  RETICCTPCT 1.3  --    Urinalysis No results found for: COLORURINE, APPEARANCEUR, LABSPEC, PHURINE, GLUCOSEU, HGBUR, BILIRUBINUR, KETONESUR, PROTEINUR, UROBILINOGEN, NITRITE, LEUKOCYTESUR Sepsis Labs Invalid input(s): PROCALCITONIN,  WBC,  LACTICIDVEN Microbiology Recent Results (from the past 240 hour(s))  MRSA PCR Screening     Status: None   Collection Time: 08/12/17 10:05 PM  Result Value Ref Range Status   MRSA by PCR NEGATIVE NEGATIVE Final    Comment:        The GeneXpert MRSA Assay (FDA approved for NASAL specimens only), is one component of a comprehensive MRSA colonization surveillance program. It is not intended to diagnose MRSA infection nor to guide or monitor treatment for MRSA infections. Performed at [facility], [address], [place], [place] [address]      Patient was seen and examined on the day of discharge and was found to  be in stable condition. Time coordinating discharge: 35 minutes including assessment and coordination of care, as well as examination of the patient.   SIGNED:  Noralee Stain, DO [facility] Pager [phone]  If 7PM-7AM, please contact night-coverage [email or web address] Password [facility] 08/13/2017, 10:57 AM

## 2017-08-13 NOTE — Telephone Encounter (Signed)
A hematology appt has been scheduled for the pt to see Dr. Clelia CroftShadad on 5/21 at 2pm. Pt is currently in the hospital.

## 2017-08-13 NOTE — Discharge Instructions (Signed)
Pulmonary Embolism A pulmonary embolism (PE) is a sudden blockage or decrease of blood flow in one lung or both lungs. Most blockages come from a blood clot that forms in a lower leg, thigh, or arm vein (deep vein thrombosis, DVT) and travels to the lungs. A clot is blood that has thickened into a gel or solid. PE is a dangerous and life-threatening condition that needs to be treated right away. What are the causes? This condition is usually caused by a blood clot that forms in a vein and moves to the lungs. In rare cases, it may be caused by air, fat, part of a tumor, or other tissue that moves through the veins and into the lungs. What increases the risk? The following factors may make you more likely to develop this condition:  Having DVT or a history of DVT.  Being older than age 60.  Personal or family history of blood clots or blood clotting disease.  Major or lengthy surgery.  Orthopedic surgery, especially hip or knee replacement.  Traumatic injury, such as breaking a hip or leg.  Spinal cord injury.  Stroke.  Taking medicines that contain estrogen. These include birth control pills and hormone replacement therapy.  Long-term (chronic) lung or heart disease.  Cancer and chemotherapy.  Having a central venous catheter.  Pregnancy and the period after delivery.  What are the signs or symptoms? Symptoms of this condition usually start suddenly and include:  Shortness of breath while active or at rest.  Coughing or coughing up blood or blood-tinged mucus.  Chest pain that is often worse with deep breaths.  Rapid or irregular heartbeat.  Feeling light-headed or dizzy.  Fainting.  Feeling anxious.  Sweating.  Pain and swelling in a leg. This is a symptom of DVT, which can lead to PE.  How is this diagnosed? This condition may be diagnosed based on:  Your medical history.  A physical exam.  Blood tests to check blood oxygen level and how well your blood  clots, and a D-dimer blood test, which checks your blood for a substance that is released when a blood clot breaks apart.  CT pulmonary angiogram. This test checks blood flow in and around your lungs.  Ventilation-perfusion scan, also called a lung VQ scan. This test measures air flow and blood flow to the lungs.  Ultrasound of the legs to look for blood clots.  How is this treated? Treatment for this conditions depends on many factors, such as the cause of your PE, your risk for bleeding or developing more clots, and other medical conditions you have. Treatment aims to remove, dissolve, or stop blood clots from forming or growing larger. Treatment may include:  Blood thinning medicines (anticoagulants) to stop clots from forming or growing. These medicines may be given as a pill, as an injection, or through an IV tube (infusion).  Medicines that dissolve clots (thrombolytics).  A procedure in which a flexible tube is used to remove a blood clot (embolectomy) or deliver medicine to destroy it (catheter-directed thrombolysis).  A procedure in which a filter is inserted into a large vein that carries blood to the heart (inferior vena cava). This filter (vena cava filter) catches blood clots before they reach the lungs.  Surgery to remove the clot (surgical embolectomy). This is rare.  You may need a combination of immediate, long-term (up to 3 months after diagnosis), and extended (more than 3 months after diagnosis) treatments. Your treatment may continue for several months (maintenance therapy).   You and your health care provider will work together to choose the treatment program that is best for you. Follow these instructions at home: If you are taking an anticoagulant medicine:  Take the medicine every day at the same time each day.  Understand what foods and drugs interact with your medicine.  Understand the side effects of this medicine, including excessive bruising or bleeding. Ask  your health care provider or pharmacist about other side effects. General instructions  Take over-the-counter and prescription medicines only as told by your health care provider.  Anticoagulant medicines may cause side effects, including easy bruising and difficulty stopping bleeding. If you are prescribed an anticoagulant: ? Hold pressure over cuts for longer than usual. ? Tell your dentist and other health care providers that you are taking anticoagulants before you have any procedure that may cause bleeding. ? Avoid contact sports. ? Be extra careful when handling sharp objects. ? Use a soft toothbrush. Floss with waxed dental floss. ? Shave with an electric razor.  Wear a medical alert bracelet or carry a medical alert card that says you have had a PE.  Ask your health care provider when you may return to your normal activities.  Talk with your health care provider about any travel plans. It is important to make sure that you are still able to take your medicine while on trips.  Keep all follow-up visits as told by your health care provider. This is important. How is this prevented? Take these actions to lower your risk of developing another PE:  Exercise regularly. Take frequent walks. For at least 30 minutes every day, engage in: ? Activity that involves moving your arms and legs. ? Activity that encourages good blood flow through your body by increasing your heart rate.  While traveling, drink plenty of water and avoid drinking alcohol. Ask your health care provider if you should wear below-the-knee compression stockings.  Avoid sitting or lying in bed for long periods of time without moving your legs. Exercise your arms and legs every hour during long-distance travel (over 4 hours).  If you are hospitalized or have surgery, ask your health care provider about your risks and what treatments can help prevent blood clots.  Maintain a healthy weight. Ask your health care  provider what weight is healthy for you.  If you are a woman who is over age 35, avoid unnecessary use of medicines that contain estrogen, including birth control pills.  Do not use any products that contain nicotine or tobacco, such as cigarettes and e-cigarettes. This is especially important if you take estrogen medicines. If you need help quitting, ask your health care provider.  See your health care provider for regular checkups. This may include blood tests and ultrasound testing on your legs to check for new blood clots.  Contact a health care provider if:  You missed a dose of your blood thinner medicine. Get help right away if:  You have new or increased pain, swelling, warmth, or redness in an arm or leg.  You have numbness or tingling in an arm or leg.  You have shortness of breath while active or at rest.  You have chest pain.  You have a rapid or irregular heartbeat.  You feel light-headed or dizzy.  You cough up blood.  You have blood in your vomit, stool, or urine.  You have a fever.  You have abdomen (abdominal) pain.  You have a severe fall or head injury.  You have a   severe headache.  You have vision changes.  You cannot move your arms or legs.  You are confused or have memory loss.  You are bleeding for 10 minutes or more, even with strong pressure on the wound. These symptoms may represent a serious problem that is an emergency. Do not wait to see if the symptoms will go away. Get medical help right away. Call your local emergency services (911 in the [place] Do not drive yourself to the hospital. Summary  A pulmonary embolism (PE) is a sudden blockage or decrease of blood flow in one lung or both lungs. PE is a dangerous and life-threatening condition that needs to be treated right away.  Having deep vein thrombosis (DVT) or a history of DVT is the most common risk factor for PE.  Treatments for this condition usually include medicines to thin  your blood (anticoagulants) or medicines to break apart blood clots (thrombolytics).  If you are prescribed blood thinners, it is important to take the medicine every single day at the same time each day.  If you have signs of PE or DVT, call your local emergency services (911 in the [place] This information is not intended to replace advice given to you by your health care provider. Make sure you discuss any questions you have with your health care provider. Document Released: 04/03/2000 Document Revised: 05/09/2016 Document Reviewed: 05/09/2016 Elsevier Interactive Patient Education  2018 [employer].   Information on my medicine - XARELTO (rivaroxaban)  This medication education was reviewed with me or my healthcare representative as part of my discharge preparation.  The pharmacist that spoke with me during my hospital stay was:  Rolley Sims, [facility]  WHY WAS [name] PRESCRIBED FOR YOU? Xarelto was prescribed to treat blood clots that may have been found in the veins of your legs (deep vein thrombosis) or in your lungs (pulmonary embolism) and to reduce the risk of them occurring again.  What do you need to know about Xarelto? The starting dose is one 15 mg tablet taken TWICE daily with food for the FIRST 21 DAYS then on 09/03/17  the dose is changed to one 20 mg tablet taken ONCE A DAY with your evening meal.  DO NOT stop taking Xarelto without talking to the health care provider who prescribed the medication.  Refill your prescription for 20 mg tablets before you run out.  After discharge, you should have regular check-up appointments with your healthcare provider that is prescribing your Xarelto.  In the future your dose may need to be changed if your kidney function changes by a significant amount.  What do you do if you miss a dose? If you are taking Xarelto TWICE DAILY and you miss a dose, take it as soon as you remember. You may take two 15 mg tablets (total 30 mg) at  the same time then resume your regularly scheduled 15 mg twice daily the next day.  If you are taking Xarelto ONCE DAILY and you miss a dose, take it as soon as you remember on the same day then continue your regularly scheduled once daily regimen the next day. Do not take two doses of Xarelto at the same time.   Important Safety Information Xarelto is a blood thinner medicine that can cause bleeding. You should call your healthcare provider right away if you experience any of the following: ? Bleeding from an injury or your nose that does not stop. ? Unusual colored urine (red or dark brown) or  unusual colored stools (red or black). ? Unusual bruising for unknown reasons. ? A serious fall or if you hit your head (even if there is no bleeding).  Some medicines may interact with Xarelto and might increase your risk of bleeding while on Xarelto. To help avoid this, consult your healthcare provider or pharmacist prior to using any new prescription or non-prescription medications, including herbals, vitamins, non-steroidal anti-inflammatory drugs (NSAIDs) and supplements.  This website has more information on Xarelto: [email or web address].xarelto.[email or web address].

## 2017-08-13 NOTE — Progress Notes (Addendum)
ANTICOAGULATION CONSULT NOTE  Pharmacy Consult for Heparin >> Xarelto Indication: pulmonary embolus  Allergies  Allergen Reactions  . Apple Diarrhea, Itching, Nausea Only and Other (See Comments)    Sore stomach and throat, per mom (NO RAW APPLES!!)  . Carrot [Daucus Carota] Diarrhea and Other (See Comments)    Sore throat and stomach, also (NO RAW CARROTS!!)  . Flovent Hfa [Fluticasone] Nausea Only and Other (See Comments)    Made chest hurt also  . Peanut Oil Other (See Comments)    Throat will become sore and stomach hurts (No hives or trouble breathing)  . Adhesive [Tape] Rash    Band-Aids   Patient Measurements: Height: 5\' 9"  (175.3 cm) Weight: 220 lb (99.8 kg) IBW/kg (Calculated) : 66.2 Heparin Dosing Weight: 87.9 kg  Vital Signs: Temp: 97.8 F (36.6 C) (04/26 0339) Temp Source: Oral (04/26 0339) BP: 123/85 (04/26 0339) Pulse Rate: 82 (04/25 2322)  Labs: Recent Labs    08/11/17 0237 08/11/17 1017 08/11/17 1150 08/11/17 1940 08/12/17 0421 08/13/17 0322  HGB 9.9*  --   --   --  10.0* 10.0*  HCT 33.6*  --   --   --  34.5* 34.7*  PLT 352  --   --   --  375 348  LABPROT  --   --  14.9  --   --   --   INR  --   --  1.18  --   --   --   HEPARINUNFRC 0.83* 0.72*  --  0.46 0.56 0.69  CREATININE 0.84  --   --   --  0.88 0.89  TROPONINI <0.03 <0.03  --   --  <0.03  --    Estimated Creatinine Clearance: 125.6 mL/min (by C-G formula based on SCr of 0.89 mg/dL).  Medical History: Past Medical History:  Diagnosis Date  . Asthma   . Eczema    Assessment: 22 yo female presented ED on 4/23 with chest pain and SOB. Patient is taking OCPs. CT shows acute PE to both lower lobes within the proximal lobar arteries. Pharmacy on board for heparin dosing.   Heparin level this morning remains therapeutic but on the higher end of the range (HL 0.69 << 0.56, goal of 0.3-0.7). Hgb/Hct trending up slightly, plts wnl. No overt s/sx of bleeding noted.   Goal of Therapy:  Heparin  level 0.3-0.7 units/ml Monitor platelets by anticoagulation protocol: Yes   Plan:  1. Reduce Heparin drip slightly to 1250 units/hr (12.5 ml/hr) 2. Will follow-up plans to transition to oral AC 3. Will continue to monitor for any signs/symptoms of bleeding and will follow up with heparin level in the a.m.   Thank you for allowing pharmacy to be a part of this patient's care.  Georgina PillionElizabeth Rechelle Niebla, PharmD, BCPS Clinical Pharmacist Pager: [phone] Clinical phone for 08/13/2017: [phone] 08/13/2017 7:58 AM    ------------------------------------------------------- Addendum:   Plans are now to transition the patient to Xarelto.  1. Start Xarelto 15 mg bid with meals for 21 days followed by 20 mg once daily with supper.  2. Will plan to educate prior to discharge  Georgina PillionElizabeth Briea Mcenery, PharmD, BCPS Pager: [phone] 11:10 AM

## 2017-08-13 NOTE — Care Management Note (Addendum)
Case Management Note  Patient Details  Name: [name] MRN: [id number] Date of Birth: [birth year]  Subjective/Objective:  From home , presents with PE, on heparin drip transitioning to xarelto, NCM gave patient the 30 free savings coupon and the 10 co pay card.                   Action/Plan: DC home when ready.  Expected Discharge Date:                  Expected Discharge Plan:  Home/Self Care  In-House Referral:     Discharge planning Services  CM Consult  Post Acute Care Choice:    Choice offered to:     DME Arranged:    DME Agency:     HH Arranged:    HH Agency:     Status of Service:  Completed, signed off  If discussed at [employer] of Stay Meetings, dates discussed:    Additional Comments:  Leone Haven, RN 08/13/2017, 10:55 AM

## 2017-08-13 NOTE — Progress Notes (Signed)
Name: [name] MRN: [id number] DOB: Jan 18, 1996    ADMISSION DATE:  08/10/2017 CONSULTATION DATE: 08/11/2017  REFERRING MD : [facility]  CHIEF COMPLAINT: Back and chest wall pain  BRIEF PATIENT DESCRIPTION: Well-nourished well-developed 22 year old female  SIGNIFICANT EVENTS  08/11/2017 evaluation for possible mucous  STUDIES:   CT of the chest was noted 2D echo was noted   HISTORY OF PRESENT ILLNESS:   22 year old F American female who reports back and chest pain intermittently with on a pain scale of 2-8 (November 2018.  She does have pain got worse over the last 24-48 hours and she presented to the hospital had a CT scan of the chest revealed bilateral lower lobe pulmonary embolism.  Subsequently she had a 2D echo performed on 08/10/2017 which showed severe right ventricular dysfunction, enlarged and sooner RV concerning for thrombus mild tricuspid valve regurgitation and moderate pulmonary hypertension.  Pulmonary critical care was consult it on 08/11/2017 for questionable EKOS.  She does meet the criteria for a excess.  Interventional radiology will be contacted for further evaluation and treatment. She has a past medical history significant for asthma.  Does take oral contraceptives for management of ovarian cyst.  Denies any recent trauma to her legs.  No recent travel that coincides with the starting of her symptoms.  Normally healthy and in no acute distress.  She does have a history of a great aunt have a pulmonary embolism in the past.  She currently is on heparin drip which precludes most of the workup for clotting disorders.  These can be's considered in the near future.  08/13/2017: Awake alert no acute distress remains on heparin drip plan is for her to be placed on Coumadin was discharged in near future.   SUBJECTIVE:  No acute distress at rest VITAL SIGNS: Temp:  [97.8 F (36.6 C)-98.3 F (36.8 C)] 98.2 F (36.8 C) (04/26 0801) Pulse Rate:  [81-97] 81 (04/26  0801) Resp:  [16-22] 16 (04/26 0801) BP: (117-128)/(73-85) 123/80 (04/26 0801) SpO2:  [95 %-100 %] 96 % (04/26 0902)  PHYSICAL EXAMINATION: General: Obese female no acute distress HEENT: Lymphadenopathy PSY: Affect normal Neuro: Intact CV: s1s2 rrr, no m/r/g PULM: even/non-labored, lungs bilaterally   WU:JWJXGI:soft, non-tender, bsx4 active  Extremities: warm/dry, negative edema  Skin: no rashes or lesions  Recent Labs  Lab 08/11/17 0237 08/12/17 0421 08/13/17 0322  NA 137 136 136  K 4.0 4.4 4.7  CL 106 106 105  CO2 21* 21* 22  BUN 8 9 8   CREATININE 0.84 0.88 0.89  GLUCOSE 136* 96 94   Recent Labs  Lab 08/11/17 0237 08/12/17 0421 08/13/17 0322  HGB 9.9* 10.0* 10.0*  HCT 33.6* 34.5* 34.7*  WBC 7.5 7.2 7.6  PLT 352 375 348   No results found.  ASSESSMENT: Principal Problem:   Acute pulmonary embolism (HCC)- Mild global reduction in LV systolic function; mild RVE with   severe RV dysfunction; large density in RV concerning for   thrombus; mild TR with moderate pulmonary hypertension   Asthma   Microcytic anemia   Ovarian cyst   Discussion: 22 year old F American female who reports back and chest pain intermittently with on a pain scale of 2-8 (November 2018.  She does have pain got worse over the last 24-48 hours and she presented to the hospital had a CT scan of the chest revealed bilateral lower lobe pulmonary embolism.  Subsequently she had a 2D echo performed on 08/10/2017 which showed severe right ventricular dysfunction, enlarged and  sooner RV concerning for thrombus mild tricuspid valve regurgitation and moderate pulmonary hypertension.  Pulmonary critical care was consult it on 08/11/2017 for questionable EKOS.  She does meet the criteria for a excess.  Interventional radiology will be contacted for further evaluation and treatment. She has a past medical history significant for asthma.  Does take oral contraceptives for management of ovarian cyst.  Denies any recent  trauma to her legs.  No recent travel that coincides with the starting of her symptoms.  Normally healthy and in no acute distress.  She does have a history of a great aunt have a pulmonary embolism in the past.  She currently is on heparin drip which precludes most of the workup for clotting disorders.  These can be's considered in the near future.  Assement stable PE with significant clot burden.  Soft case for sub massive as Troponin is negative, DVT study is negative, Some RV strain reported on Echo but hemodynamic stability and HR is ok  Plan: Continue heparin Negative DVT Currently on heparin drip transitioning to Coumadin Pulmonary critical care will sign off at this time.   Brett Canales Minor ACNP [facility] PCCM Pager [phone] till 1 pm If no answer page [phone] 08/13/2017, 9:48 AM

## 2017-09-07 ENCOUNTER — Telehealth: Payer: Self-pay

## 2017-09-07 ENCOUNTER — Inpatient Hospital Stay: Payer: Federal, State, Local not specified - PPO

## 2017-09-07 ENCOUNTER — Inpatient Hospital Stay: Payer: Federal, State, Local not specified - PPO | Attending: Oncology | Admitting: Oncology

## 2017-09-07 VITALS — BP 136/67 | HR 75 | Temp 97.8°F | Resp 18 | Ht 69.0 in | Wt 227.0 lb

## 2017-09-07 DIAGNOSIS — Z793 Long term (current) use of hormonal contraceptives: Secondary | ICD-10-CM | POA: Insufficient documentation

## 2017-09-07 DIAGNOSIS — I2690 Septic pulmonary embolism without acute cor pulmonale: Secondary | ICD-10-CM

## 2017-09-07 DIAGNOSIS — Z7901 Long term (current) use of anticoagulants: Secondary | ICD-10-CM | POA: Diagnosis not present

## 2017-09-07 DIAGNOSIS — Z79899 Other long term (current) drug therapy: Secondary | ICD-10-CM | POA: Diagnosis not present

## 2017-09-07 DIAGNOSIS — D509 Iron deficiency anemia, unspecified: Secondary | ICD-10-CM | POA: Insufficient documentation

## 2017-09-07 DIAGNOSIS — I2609 Other pulmonary embolism with acute cor pulmonale: Secondary | ICD-10-CM | POA: Insufficient documentation

## 2017-09-07 DIAGNOSIS — E669 Obesity, unspecified: Secondary | ICD-10-CM | POA: Diagnosis not present

## 2017-09-07 DIAGNOSIS — J45909 Unspecified asthma, uncomplicated: Secondary | ICD-10-CM | POA: Diagnosis not present

## 2017-09-07 LAB — ANTITHROMBIN III: ANTITHROMB III FUNC: 105 % (ref 75–120)

## 2017-09-07 NOTE — Progress Notes (Signed)
Reason for Referral: Pulmonary embolism  HPI: 22 year old woman native of [place] and currently lives in [place] while she is going to school.  She has history of asthma but no other significant comorbid conditions.  She presented with acute left-sided chest pain in April 2019.  At that time her evaluation she had a CT scan of the chest which showed a positive for acute PE in both lower lobes within the proximal lobar arteries and borderline right heart strain.  She was treated with heparin drip and subsequently transitioned into Xarelto outpatient.  Her symptoms have resolved without any issues related to Xarelto treatment.  She denies any dyspnea on exertion or chest pain.  She denies any bleeding issues.  She has been on oral contraceptives prior to her admission.  She denies any orthopedic procedure, long car rides or immobility.  She denies any family history of thrombosis.  She was diagnosed with iron deficiency and was started on oral iron replacement.  She denies any heavy menstrual cycles on Xarelto.  She does not report any headaches, blurry vision, syncope or seizures. Does not report any fevers, chills or sweats.  Does not report any cough, wheezing or hemoptysis.  Does not report any chest pain, palpitation, orthopnea or leg edema.  Does not report any nausea, vomiting or abdominal pain.  Does not report any constipation or diarrhea.  Does not report any skeletal complaints.    Does not report frequency, urgency or hematuria.  Does not report any skin rashes or lesions. Does not report any heat or cold intolerance.  Does not report any lymphadenopathy or petechiae.  Does not report any anxiety or depression.  Remaining review of systems is negative.    Past Medical History:  Diagnosis Date  . Asthma   . Eczema   :  No past surgical history on file.:   Current Outpatient Medications:  .  albuterol (PROVENTIL) (5 MG/ML) 0.5% nebulizer solution, Take 2.5 mg by nebulization every 6  (six) hours as needed for wheezing or shortness of breath., Disp: , Rfl:  .  bisacodyl (DULCOLAX) 5 MG EC tablet, Take 1 tablet (5 mg total) by mouth daily., Disp: 30 tablet, Rfl: 0 .  ferrous sulfate 325 (65 FE) MG tablet, Take 1 tablet (325 mg total) by mouth 2 (two) times daily with a meal., Disp: 60 tablet, Rfl: 0 .  fluticasone (FLONASE) 50 MCG/ACT nasal spray, Place 1-2 sprays into both nostrils daily as needed (for seasonal allergies). , Disp: , Rfl:  .  Fluticasone-Salmeterol (ADVAIR DISKUS) 250-50 MCG/DOSE AEPB, Inhale 1 puff into the lungs every 12 (twelve) hours., Disp: , Rfl:  .  levalbuterol (XOPENEX HFA) 45 MCG/ACT inhaler, Inhale 2 puffs into the lungs every 6 (six) hours as needed for wheezing or shortness of breath. , Disp: , Rfl: 0 .  montelukast (SINGULAIR) 10 MG tablet, Take 10 mg by mouth at bedtime., Disp: , Rfl:  .  Rivaroxaban 15 & 20 MG TBPK, Start with one  tablet by mouth twice a day with food. On Day 22, switch to one  tablet once a day with food., Disp: 51 each, Rfl: 0 .  triamcinolone cream (KENALOG) 0.1 %, Apply 1 application topically 2 (two) times daily as needed (for itching). , Disp: , Rfl: :  Allergies  Allergen Reactions  . Apple Diarrhea, Itching, Nausea Only and Other (See Comments)    Sore stomach and throat, per mom (NO RAW APPLES!!)  . Carrot [Daucus Carota] Diarrhea and Other (See  Comments)    Sore throat and stomach, also (NO RAW CARROTS!!)  . Flovent Hfa [Fluticasone] Nausea Only and Other (See Comments)    Made chest hurt also  . Peanut Oil Other (See Comments)    Throat will become sore and stomach hurts (No hives or trouble breathing)  . Adhesive [Tape] Rash    Band-Aids  :  Family History  Problem Relation Age of Onset  . Pulmonary embolism Other   :  Social History   Socioeconomic History  . Marital status: Single    Spouse name: Not on file  . Number of children: Not on file  . Years of education: Not on file  . Highest  education level: Not on file  Occupational History  . Not on file  Social Needs  . Financial resource strain: Not on file  . Food insecurity:    Worry: Not on file    Inability: Not on file  . Transportation needs:    Medical: Not on file    Non-medical: Not on file  Tobacco Use  . Smoking status: Never Smoker  . Smokeless tobacco: Never Used  Substance and Sexual Activity  . Alcohol use: Never    Frequency: Never  . Drug use: Never  . Sexual activity: Not on file  Lifestyle  . Physical activity:    Days per week: Not on file    Minutes per session: Not on file  . Stress: Not on file  Relationships  . Social connections:    Talks on phone: Not on file    Gets together: Not on file    Attends religious service: Not on file    Active member of club or organization: Not on file    Attends meetings of clubs or organizations: Not on file    Relationship status: Not on file  . Intimate partner violence:    Fear of current or ex partner: Not on file    Emotionally abused: Not on file    Physically abused: Not on file    Forced sexual activity: Not on file  Other Topics Concern  . Not on file  Social History Narrative  . Not on file  :  Pertinent items are noted in HPI.  Exam: Blood pressure 136/67, pulse 75, temperature 97.8 F (36.6 C), temperature source Oral, resp. rate 18, height  (1.753 m), weight 227 lb (103 kg), SpO2 98 %.  ECOG 0 General appearance: alert and cooperative appeared without distress. Head: atraumatic without any abnormalities. Eyes: conjunctivae/corneas clear. PERRL.  Sclera anicteric. Throat: lips, mucosa, and tongue normal; without oral thrush or ulcers. Resp: clear to auscultation bilaterally without rhonchi, wheezes or dullness to percussion. Cardio: regular rate and rhythm, S1, S2 normal, no murmur, click, rub or gallop GI: soft, non-tender; bowel sounds normal; no masses,  no organomegaly Skin: Skin color, texture, turgor normal. No  rashes or lesions Lymph nodes: Cervical, supraclavicular, and axillary nodes normal. Neurologic: Grossly normal without any motor, sensory or deep tendon reflexes. Musculoskeletal: No joint deformity or effusion.  CBC    Component Value Date/Time   WBC 7.6 08/13/2017 0322   RBC 4.76 08/13/2017 0322   HGB 10.0 (L) 08/13/2017 0322   HCT 34.7 (L) 08/13/2017 0322   HCT 34.4 08/12/2017 0758   PLT 348 08/13/2017 0322   MCV 72.9 (L) 08/13/2017 0322   MCH 21.0 (L) 08/13/2017 0322   MCHC 28.8 (L) 08/13/2017 0322   RDW 17.1 (H) 08/13/2017 1610  Chemistry      Component Value Date/Time   NA 136 08/13/2017 0322   K 4.7 08/13/2017 0322   CL 105 08/13/2017 0322   CO2 22 08/13/2017 0322   BUN 8 08/13/2017 0322   CREATININE 0.89 08/13/2017 0322      Component Value Date/Time   CALCIUM 9.4 08/13/2017 0322       Ct Angio Chest Pe W And/or Wo Contrast  Result Date: 08/10/2017 CLINICAL DATA:  Chest pain since November, worse with deep inspiration and progressing over the past several weeks. EXAM: CT ANGIOGRAPHY CHEST WITH CONTRAST TECHNIQUE: Multidetector CT imaging of the chest was performed using the standard protocol during bolus administration of intravenous contrast. Multiplanar CT image reconstructions and MIPs were obtained to evaluate the vascular anatomy. CONTRAST:  60 cc ISOVUE-370 IOPAMIDOL (ISOVUE-370) INJECTION 76% COMPARISON:  Same day CXR FINDINGS: Cardiovascular: The study is of quality for the evaluation of pulmonary embolism. Bilateral pulmonary emboli are identified to both lower lobes at the origins of the lobar arteries. RV/LV ratio 0.69. No pericardial effusion or thickening. Heart size is normal. No aortic aneurysm or dissection. Mediastinum/Nodes: No discrete thyroid nodules. Unremarkable esophagus. No pathologically enlarged axillary, mediastinal or hilar lymph nodes. Lungs/Pleura: No pneumothorax. No pleural effusion. Pleural-based opacities in the lingula and left lower  lobe compatible with stigmata of pulmonary infarcts or atelectasis. There is some dependent atelectasis bilaterally. No effusion or pneumothorax. Upper abdomen: Unremarkable. Musculoskeletal:  No aggressive appearing focal osseous lesions. Review of the MIP images confirms the above findings. IMPRESSION: 1. Positive for acute PE to both lower lobes within the proximal lobar arteries and borderline right heart strain (RV/LV Ratio = 0.69. 2. Pleural-based lingular and left lower lobe opacities some which are triangular in shape raise suspicion for stigmata of pulmonary infarcts and/or atelectasis. These results were called by telephone at the time of interpretation on 08/10/2017 at 7:42 pm to PA Burna Forts, who verbally acknowledged these results. ) Electronically Signed   By: Tollie Eth M.D.   On: 08/10/2017 19:45    Assessment and Plan:   22 year old woman with the following issues:  1.  Pulmonary embolism diagnosed in April 2019.  She presented with acute chest pain and a CT scan on August 10, 2017 showed bilateral pulmonary emboli with borderline heart strain.  Her lower extremity Dopplers were negative for DVT.  She was on oral contraceptives.  The natural course of acquired and inherited thrombophilia was discussed today with the patient and her family.  Provoking factors in her case include oral contraceptives and mild obesity.  She has travel to [facility] and [place] previously but not directly before her recent episode.  She has no family history of thrombosis.  Acquired and inherited thrombophilia screen would be reasonable at this time.  Risks and benefits of obtaining a hypercoagulable panel was discussed today and she has no objections.  Although it might not change the duration of treatment but certainly will have multitude of impact on her future care.  I have recommended full dose anticoagulation for at least 6 months and she will return to discuss whether further anticoagulation or work-up  is needed.  She will obtain a hypercoagulable panel today.  She is also planning a trip to [place] this summer.  I see no objections at this time as long as she is on full dose anticoagulation.  I recommended adequate hydration as well as moving every 2 hours while she is on the plane.  2.  Iron deficiency  anemia: Related to chronic menstrual blood losses.  She is currently on iron replacement.   3.  Contraception: I recommended using non-hormone based contraception at this time given her recent thromboembolism.  4.  Follow-up: We will be in 6 months follow her progress.  45  minutes was spent with the patient face-to-face today.  More than 50% of time was dedicated to patient counseling, education and answering question regarding her future plan of care.

## 2017-09-07 NOTE — Telephone Encounter (Signed)
Printed avs and calender of upcoming appointment.  Per 5/21 los 

## 2017-09-08 LAB — LUPUS ANTICOAGULANT PANEL
DRVVT: 85.5 s — ABNORMAL HIGH (ref 0.0–47.0)
PTT Lupus Anticoagulant: 41.6 s (ref 0.0–51.9)

## 2017-09-08 LAB — DRVVT CONFIRM: DRVVT CONFIRM: 1.7 ratio — AB (ref 0.8–1.2)

## 2017-09-08 LAB — PROTEIN C ACTIVITY: PROTEIN C ACTIVITY: 82 % (ref 73–180)

## 2017-09-08 LAB — DRVVT MIX: DRVVT MIX: 56.5 s — AB (ref 0.0–47.0)

## 2017-09-08 LAB — PROTEIN S ACTIVITY: Protein S Activity: 79 % (ref 63–140)

## 2017-09-08 LAB — PROTEIN S, TOTAL: Protein S Ag, Total: 105 % (ref 60–150)

## 2017-09-08 LAB — HOMOCYSTEINE: HOMOCYSTEINE-NORM: 9.5 umol/L (ref 0.0–15.0)

## 2017-09-10 ENCOUNTER — Other Ambulatory Visit: Payer: Self-pay | Admitting: Oncology

## 2017-09-10 DIAGNOSIS — I2601 Septic pulmonary embolism with acute cor pulmonale: Secondary | ICD-10-CM

## 2017-09-10 LAB — BETA-2-GLYCOPROTEIN I ABS, IGG/M/A
Beta-2 Glyco I IgG: <9 GPI IgG units (ref 0–20)
Beta-2-Glycoprotein I IgA: <9 GPI IgA units (ref 0–25)
Beta-2-Glycoprotein I IgM: <9 GPI IgM units (ref 0–32)

## 2017-09-10 LAB — CARDIOLIPIN ANTIBODIES, IGG, IGM, IGA

## 2017-09-10 MED ORDER — RIVAROXABAN 20 MG PO TABS: 20.0000 mg | ORAL_TABLET | Freq: Every day | ORAL | 1 refills | Status: DC

## 2017-09-10 NOTE — Progress Notes (Signed)
3:55 pm Patient's mother called requesting "Xtandi refill be sent to [facility] in either [place] or [place].  Daughter went to pharmacy but order has not been received.  Started starter in hospital and will run out this weekend."  Collaborative notified.

## 2017-09-12 LAB — PROTEIN C, TOTAL: Protein C, Total: 75 % (ref 60–150)

## 2017-09-14 LAB — PROTHROMBIN GENE MUTATION

## 2017-09-14 LAB — FACTOR 5 LEIDEN

## 2017-11-12 ENCOUNTER — Other Ambulatory Visit: Payer: Self-pay | Admitting: Oncology

## 2017-11-12 DIAGNOSIS — I2601 Septic pulmonary embolism with acute cor pulmonale: Secondary | ICD-10-CM

## 2018-03-09 ENCOUNTER — Inpatient Hospital Stay: Payer: Federal, State, Local not specified - PPO | Attending: Oncology | Admitting: Oncology

## 2018-03-09 ENCOUNTER — Telehealth: Payer: Self-pay | Admitting: Oncology

## 2018-03-09 VITALS — BP 133/76 | HR 74 | Temp 98.2°F | Resp 17 | Ht 69.0 in | Wt 239.4 lb

## 2018-03-09 DIAGNOSIS — D509 Iron deficiency anemia, unspecified: Secondary | ICD-10-CM

## 2018-03-09 DIAGNOSIS — E669 Obesity, unspecified: Secondary | ICD-10-CM

## 2018-03-09 DIAGNOSIS — I2609 Other pulmonary embolism with acute cor pulmonale: Secondary | ICD-10-CM

## 2018-03-09 DIAGNOSIS — I2601 Septic pulmonary embolism with acute cor pulmonale: Secondary | ICD-10-CM

## 2018-03-09 DIAGNOSIS — Z7901 Long term (current) use of anticoagulants: Secondary | ICD-10-CM

## 2018-03-09 DIAGNOSIS — D6862 Lupus anticoagulant syndrome: Secondary | ICD-10-CM

## 2018-03-09 DIAGNOSIS — Z79899 Other long term (current) drug therapy: Secondary | ICD-10-CM | POA: Diagnosis not present

## 2018-03-09 NOTE — Progress Notes (Signed)
Hematology and Oncology Follow Up Visit  [name] [id number] [birth year] 22 y.o. 03/09/2018 4:12 PM System, Pcp Not InNo ref. provider found   Principle Diagnosis: 22 year old woman with pulmonary embolism diagnosed in April 2019.  This episode was provoked after long car ride while she is on oral contraceptives.   Current therapy: Xarelto started in April 2019 currently at 20 mg daily.  Interim History: [name] presents today for a follow-up visit.  Since her last visit, she remains on Xarelto which she has tolerated very well.  She denies any active bleeding at this time including no hematochezia, melena or menorrhagia.  She has very intermittent menstrual bleeding and currently on progesterone-based contraception.  She denies any respiratory complaints and breathing status is back to baseline.  She does not report any headaches, blurry vision, syncope or seizures. Does not report any fevers, chills or sweats.  Does not report any cough, wheezing or hemoptysis.  Does not report any chest pain, palpitation, orthopnea or leg edema.  Does not report any nausea, vomiting or abdominal pain.  Does not report any constipation or diarrhea.  Does not report any skeletal complaints.    Does not report frequency, urgency or hematuria.  Does not report any skin rashes or lesions.  Does not report any lymphadenopathy or petechiae.  Does not report any anxiety or depression.  Remaining review of systems is negative.    Medications: I have reviewed the patient's current medications.  Current Outpatient Medications  Medication Sig Dispense Refill  . albuterol (PROVENTIL) (5 MG/ML) 0.5% nebulizer solution Take 2.5 mg by nebulization every 6 (six) hours as needed for wheezing or shortness of breath.    . bisacodyl (DULCOLAX) 5 MG EC tablet Take 1 tablet (5 mg total) by mouth daily. 30 tablet 0  . ferrous sulfate 325 (65 FE) MG tablet Take 1 tablet (325 mg total) by mouth 2 (two) times daily with a  meal. 60 tablet 0  . fluticasone (FLONASE) 50 MCG/ACT nasal spray Place 1-2 sprays into both nostrils daily as needed (for seasonal allergies).     . Fluticasone-Salmeterol (ADVAIR DISKUS) 250-50 MCG/DOSE AEPB Inhale 1 puff into the lungs every 12 (twelve) hours.    Marland Kitchen. levalbuterol (XOPENEX HFA) 45 MCG/ACT inhaler Inhale 2 puffs into the lungs every 6 (six) hours as needed for wheezing or shortness of breath.   0  . montelukast (SINGULAIR) 10 MG tablet Take 10 mg by mouth at bedtime.    . triamcinolone cream (KENALOG) 0.1 % Apply 1 application topically 2 (two) times daily as needed (for itching).     Carlena Hurl. XARELTO 20 MG TABS tablet TAKE 1 TABLET BY MOUTH DAILY WITH SUPPER 30 tablet 1   No current facility-administered medications for this visit.      Allergies:  Allergies  Allergen Reactions  . Apple Diarrhea, Itching, Nausea Only and Other (See Comments)    Sore stomach and throat, per mom (NO RAW APPLES!!)  . Carrot [Daucus Carota] Diarrhea and Other (See Comments)    Sore throat and stomach, also (NO RAW CARROTS!!)  . Flovent Hfa [Fluticasone] Nausea Only and Other (See Comments)    Made chest hurt also  . Peanut Oil Other (See Comments)    Throat will become sore and stomach hurts (No hives or trouble breathing)  . Adhesive [Tape] Rash    Band-Aids    Past Medical History, Surgical history, Social history, and Family History were reviewed and updated.  Review of Systems:  Remaining ROS  negative.  Physical Exam: Blood pressure 133/76, pulse 74, temperature 98.2 F (36.8 C), temperature source Oral, resp. rate 17, height 5\' 9"  (1.753 m), weight 239 lb 6.4 oz (108.6 kg), SpO2 100 %. ECOG: 0 General appearance: alert and cooperative appeared without distress. Head: Normocephalic, without obvious abnormality Oropharynx: No oral thrush or ulcers. Eyes: No scleral icterus.  Pupils are equal and round reactive to light. Lymph nodes: Cervical, supraclavicular, and axillary nodes  normal. Heart:regular rate and rhythm, S1, S2 normal, no murmur, click, rub or gallop Lung:chest clear, no wheezing, rales, normal symmetric air entry Abdomin: soft, non-tender, without masses or organomegaly. Neurological: No motor, sensory deficits.  Intact deep tendon reflexes. Skin: No rashes or lesions.  No ecchymosis or petechiae. Musculoskeletal: No joint deformity or effusion. Psychiatric: Mood and affect are appropriate.    Lab Results: Lab Results  Component Value Date   WBC 7.6 08/13/2017   HGB 10.0 (L) 08/13/2017   HCT 34.7 (L) 08/13/2017   MCV 72.9 (L) 08/13/2017   PLT 348 08/13/2017     Chemistry      Component Value Date/Time   NA 136 08/13/2017 0322   K 4.7 08/13/2017 0322   CL 105 08/13/2017 0322   CO2 22 08/13/2017 0322   BUN 8 08/13/2017 0322   CREATININE 0.89 08/13/2017 0322      Component Value Date/Time   CALCIUM 9.4 08/13/2017 0322       Impression and Plan:  22 year old woman with   1.  Pulmonary embolism presented with shortness of breath and borderline heart strain with negative Dopplers diagnosed in April 2019.    She is currently on Xarelto which she has tolerated very well.  Her hypercoagulable panel was obtained in May 2019 and reviewed today which showed a positive lupus anticoagulant but otherwise negative panel.  The implication of these finding the differential diagnosis was reviewed today with the patient and her mother.  This finding could be a false positive but could also be a true findings that have contributed to her pulmonary embolism.  The recommendation with these findings is to anticoagulate indefinitely as long as these findings are true positive lupus anticoagulant findings.  After discussion today, I have recommended extending her anticoagulation for another 6 months and consider repeating lupus anticoagulant off Xarelto at that time to confirm whether it is a true finding.  She is agreeable with this plan.   2.   Follow-up: We will be in 6 months to reevaluate her status.  15  minutes was spent with the patient face-to-face today.  More than 50% of time was dedicated to discussing her disease status, treatment options and answering questions regarding long-term plan of care.   Eli Hose, MD 11/20/20194:12 PM

## 2018-03-09 NOTE — Telephone Encounter (Signed)
Gave patient avs report and appointments for July 2020. Date per patient due to family vacation out of the country.

## 2018-11-01 ENCOUNTER — Ambulatory Visit: Payer: Federal, State, Local not specified - PPO | Admitting: Oncology

## 2018-11-02 ENCOUNTER — Telehealth: Payer: Self-pay | Admitting: Oncology

## 2018-11-02 NOTE — Telephone Encounter (Signed)
Called pt per 7/14 sch message- no answer - left message for patient to call back and reschedule missed appt .

## 2019-03-09 IMAGING — CT CT ANGIO CHEST
2 of 6 series · 18 of 36 positions shown · IV contrast (iopamidol)
Comparison: Same day CXR

CLINICAL DATA: Chest pain since [REDACTED], worse with deep
inspiration and progressing over the past several weeks.

EXAM:
CT ANGIOGRAPHY CHEST WITH CONTRAST
TECHNIQUE: Multidetector CT imaging of the chest was performed using the
standard protocol during bolus administration of intravenous
contrast. Multiplanar CT image reconstructions and MIPs were
obtained to evaluate the vascular anatomy.
CONTRAST:  60 cc 1NVHK8-MF2 IOPAMIDOL (1NVHK8-MF2) INJECTION 76%

[Series 7: pe thins · axial · 0.81mm/px · z∈[-386,-134]mm · 17 of 284 slices shown]
[im 16/284  lung]
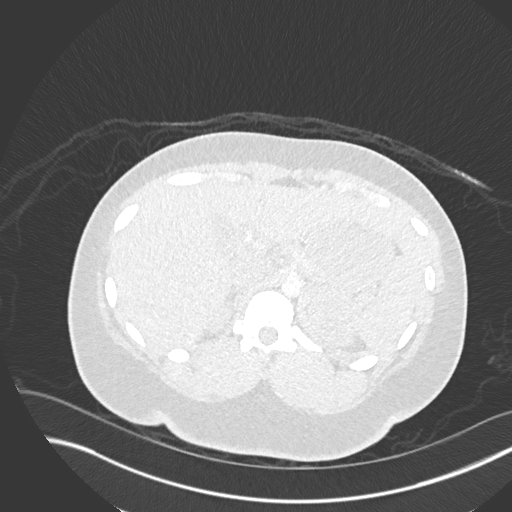
[im 32/284  mediastinal]
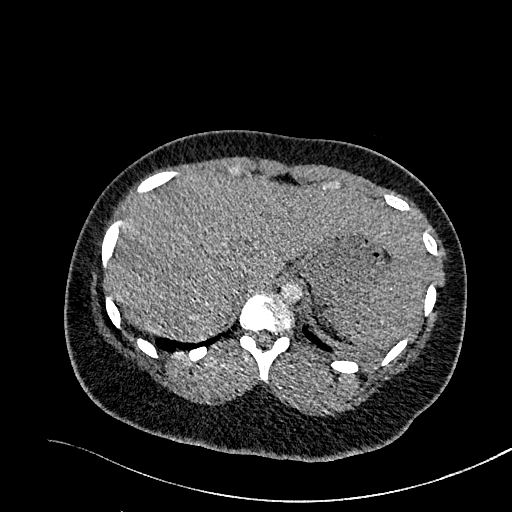
[im 48/284  lung]
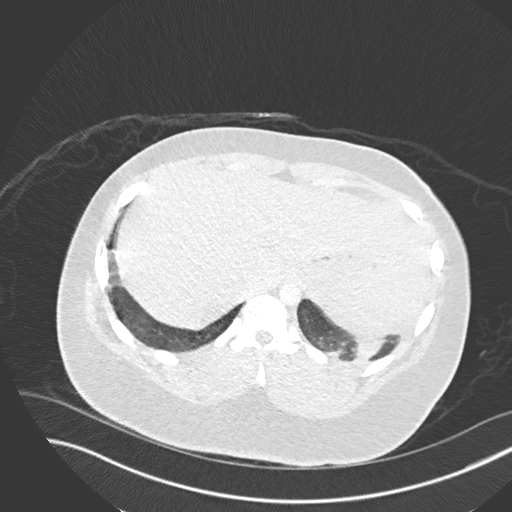
[im 63/284  mediastinal]
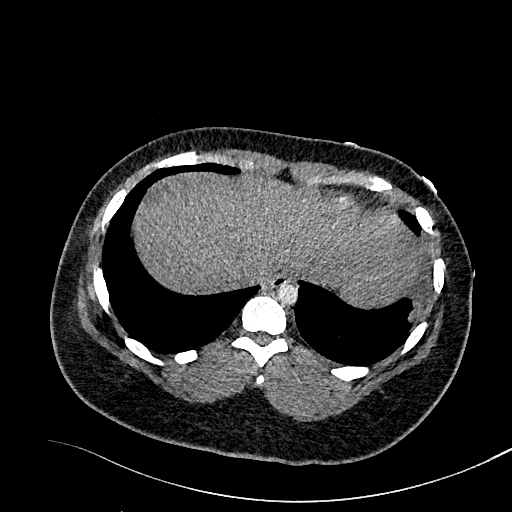
[im 79/284  lung]
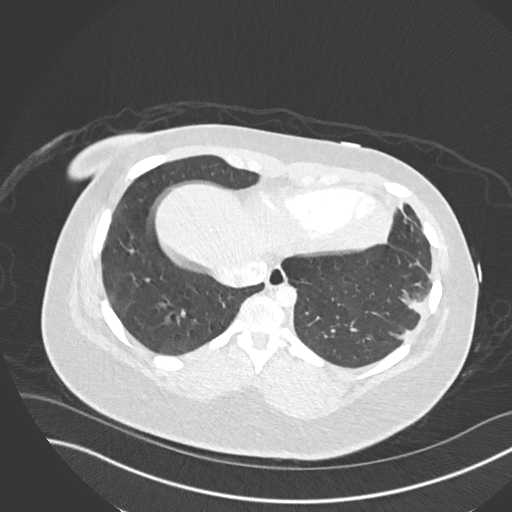
[im 95/284  mediastinal]
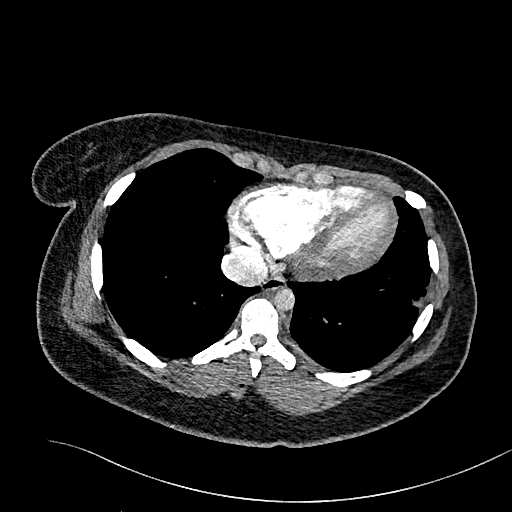
[im 111/284  lung]
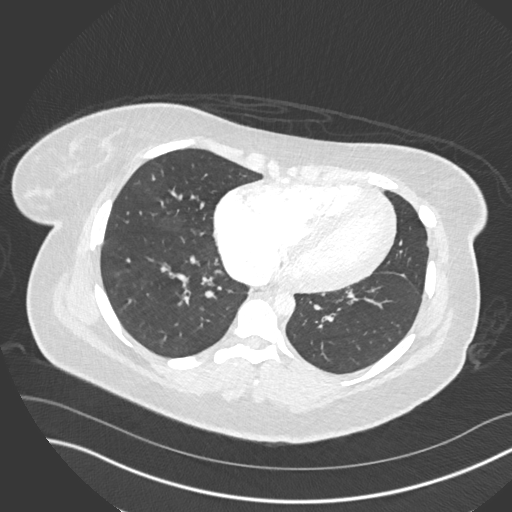
[im 126/284  mediastinal]
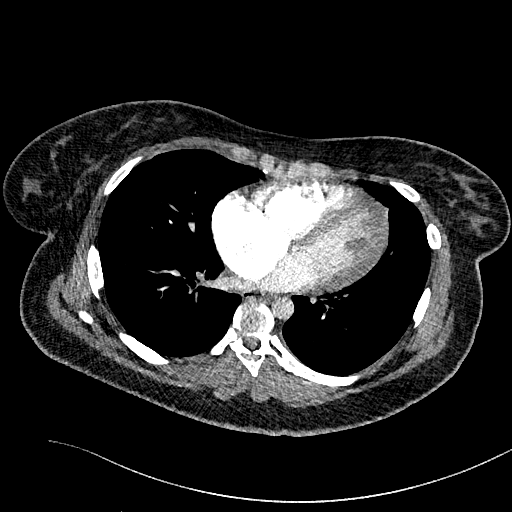
[im 142/284  lung]
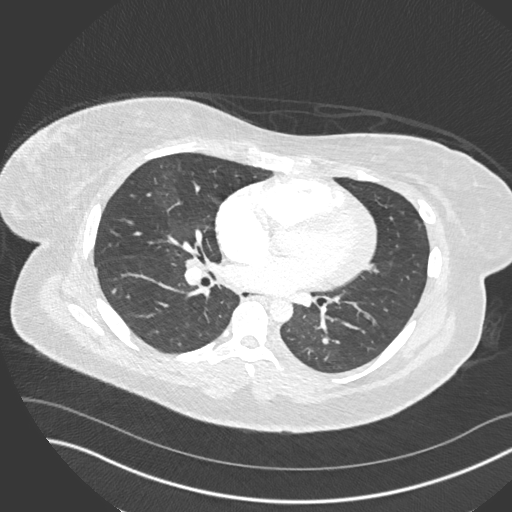
[im 158/284  mediastinal]
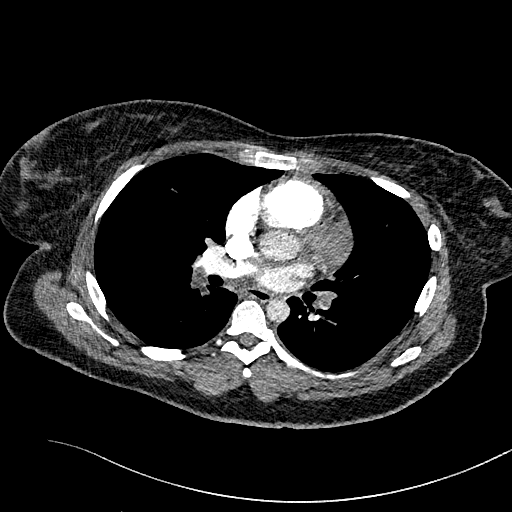
[im 173/284  lung]
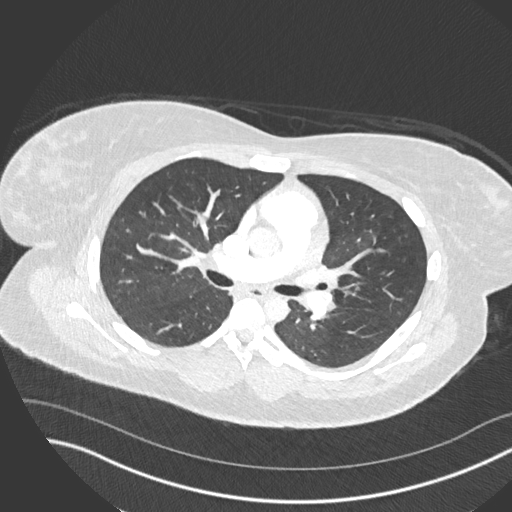
[im 189/284  mediastinal]
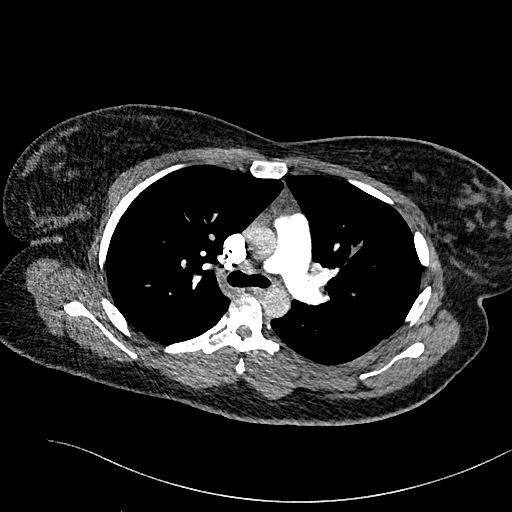
[im 205/284  lung]
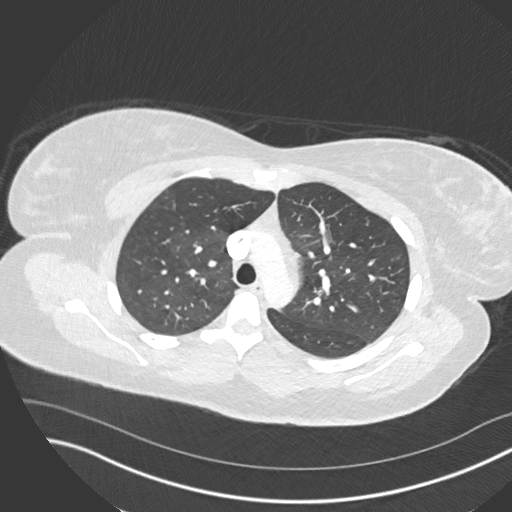
[im 221/284  mediastinal]
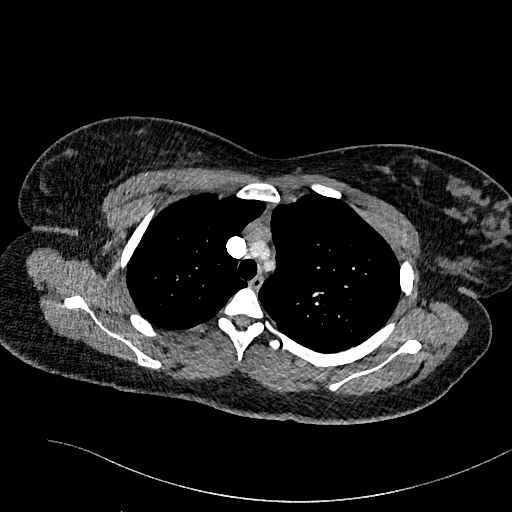
[im 236/284  lung]
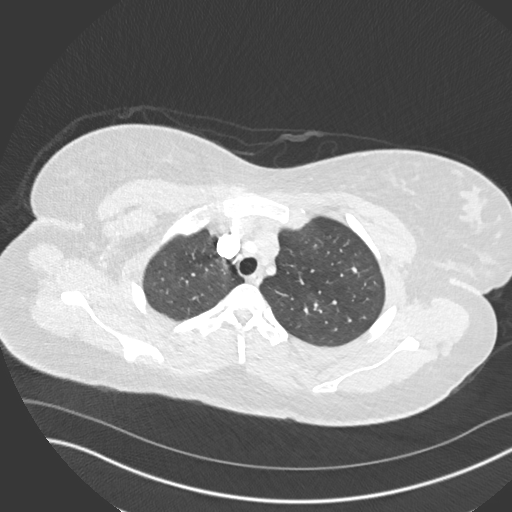
[im 252/284  mediastinal]
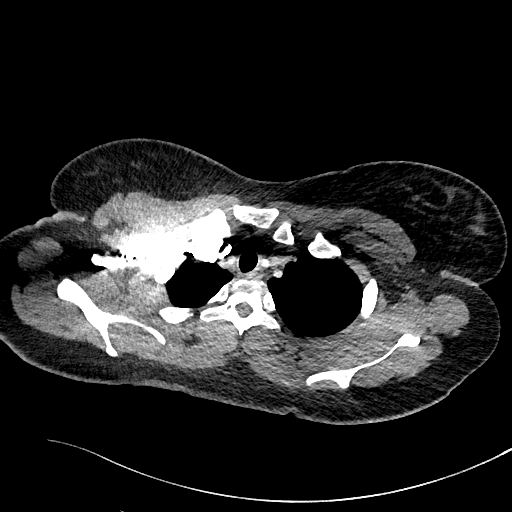
[im 268/284  lung]
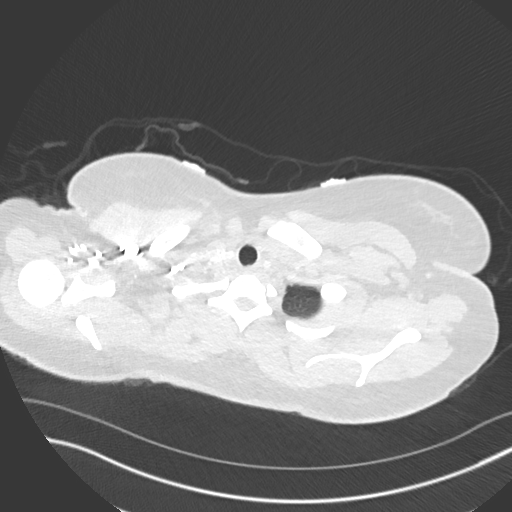

[Series 8: pe 2mm cor · coronal · 0.59mm/px · 1 of 137 slices shown]
[im 69/137  mediastinal]
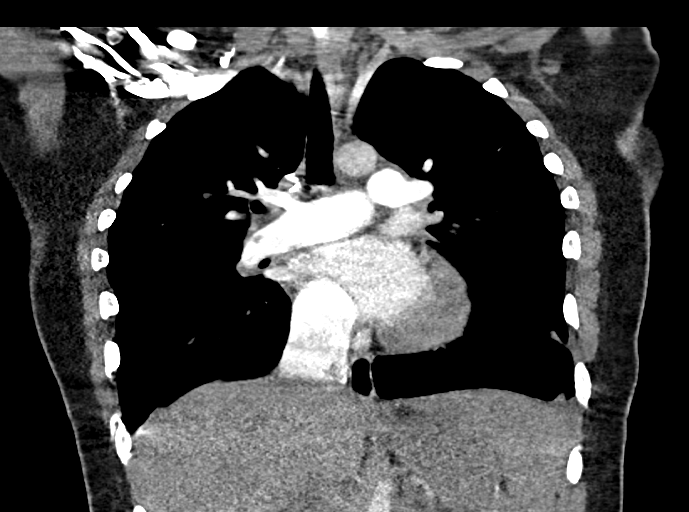

[18 of 36 positions shown; findings below may reference images not displayed]

FINDINGS: Cardiovascular: The study is of quality for the evaluation of
pulmonary embolism. Bilateral pulmonary emboli are identified to
both lower lobes at the origins of the lobar arteries. RV/LV ratio
0.69. No pericardial effusion or thickening. Heart size is normal.
No aortic aneurysm or dissection.

Mediastinum/Nodes: No discrete thyroid nodules. Unremarkable
esophagus. No pathologically enlarged axillary, mediastinal or hilar
lymph nodes.

Lungs/Pleura: No pneumothorax. No pleural effusion. Pleural-based
opacities in the lingula and left lower lobe compatible with
stigmata of pulmonary infarcts or atelectasis. There is some
dependent atelectasis bilaterally. No effusion or pneumothorax.

Upper abdomen: Unremarkable.

Musculoskeletal:  No aggressive appearing focal osseous lesions.

Review of the MIP images confirms the above findings.
IMPRESSION: 1. Positive for acute PE to both lower lobes within the proximal
lobar arteries and borderline right heart strain (RV/LV Ratio =
0.69.
2. Pleural-based lingular and left lower lobe opacities some which
are triangular in shape raise suspicion for stigmata of pulmonary
infarcts and/or atelectasis.

These results were called by telephone at the time of interpretation
on 08/10/2017 at [DATE] to PA Adenyo Ologo, who verbally acknowledged
these results. )

## 2019-03-09 IMAGING — CR DG CHEST 2V
2 series · 2 of 2 positions shown · non-contrast
Comparison: None.

CLINICAL DATA: Left-sided chest pain.  Shortness of breath.

EXAM:
CHEST - 2 VIEW

[chest pa]
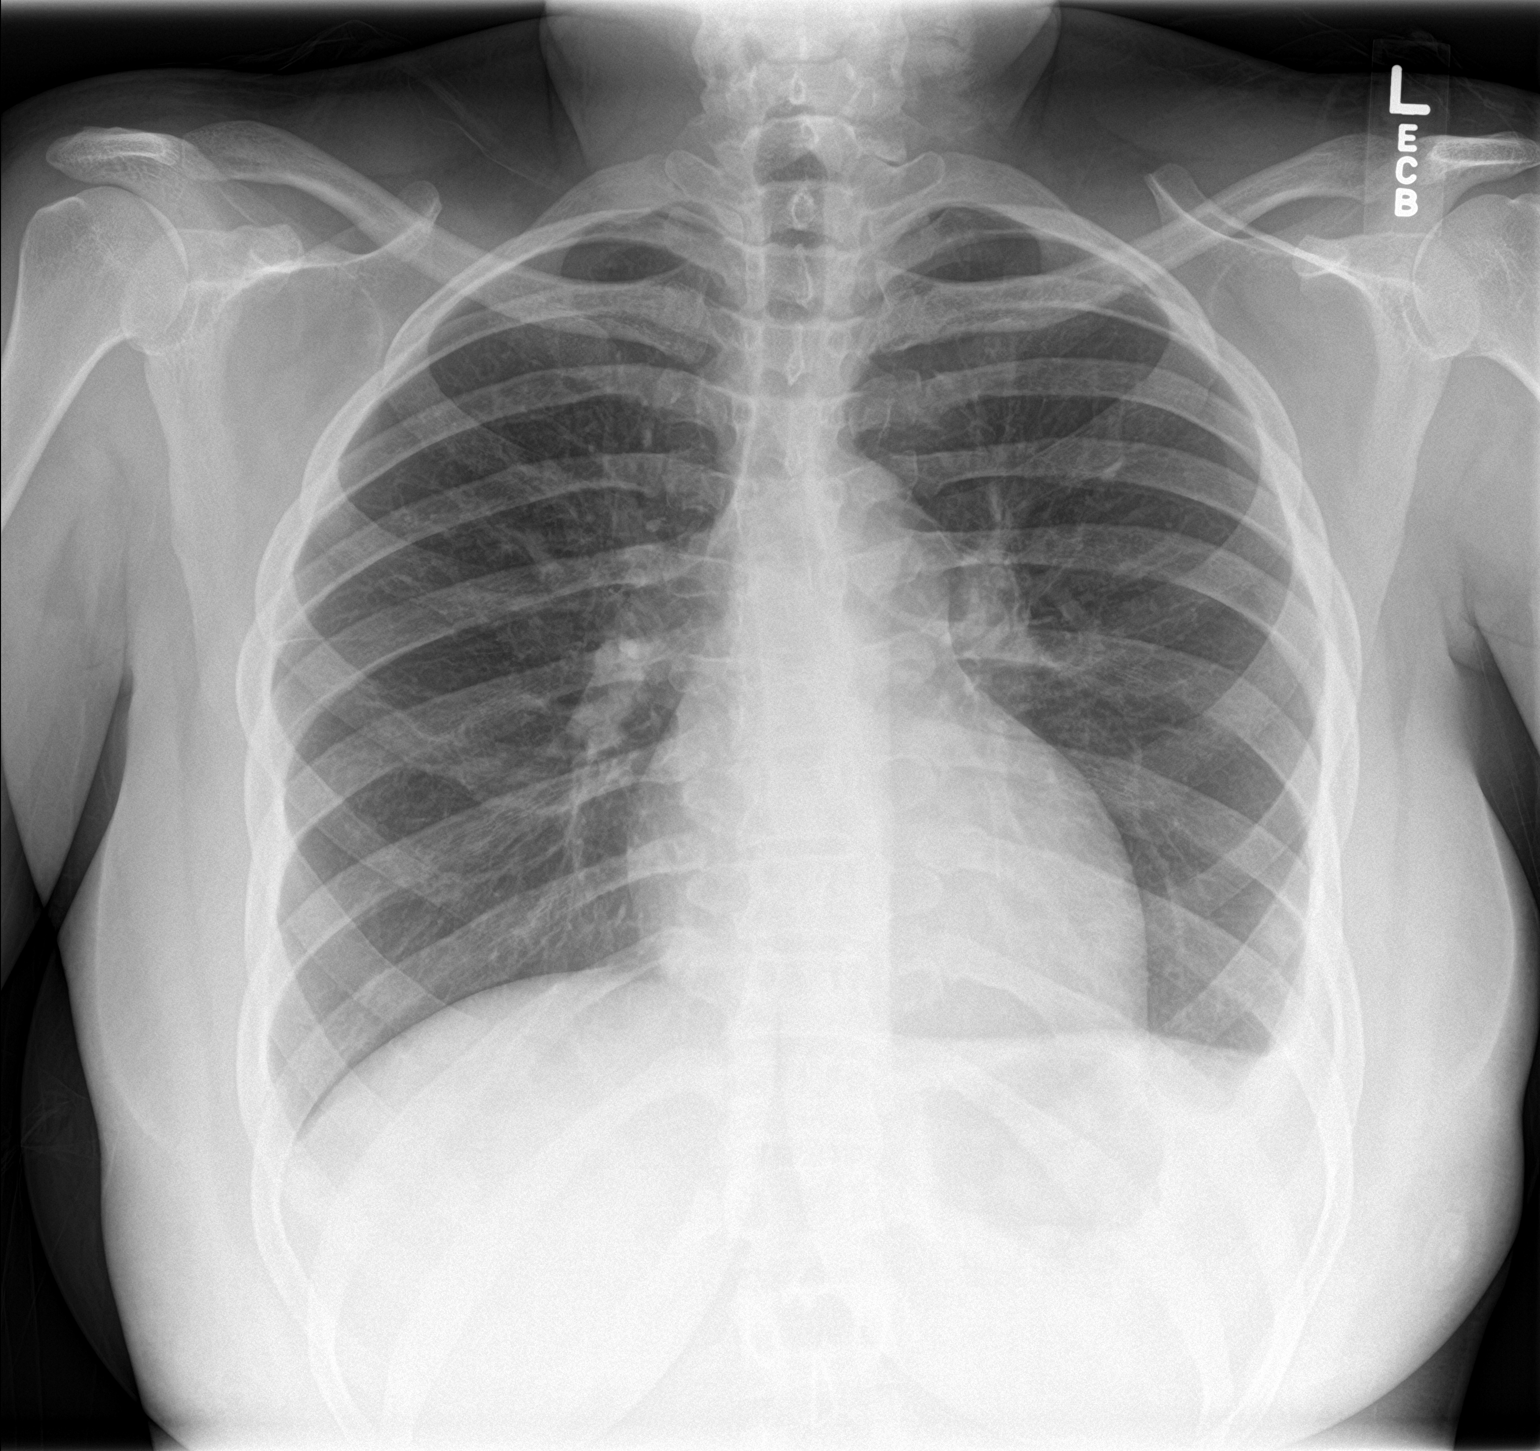

[chest lat]
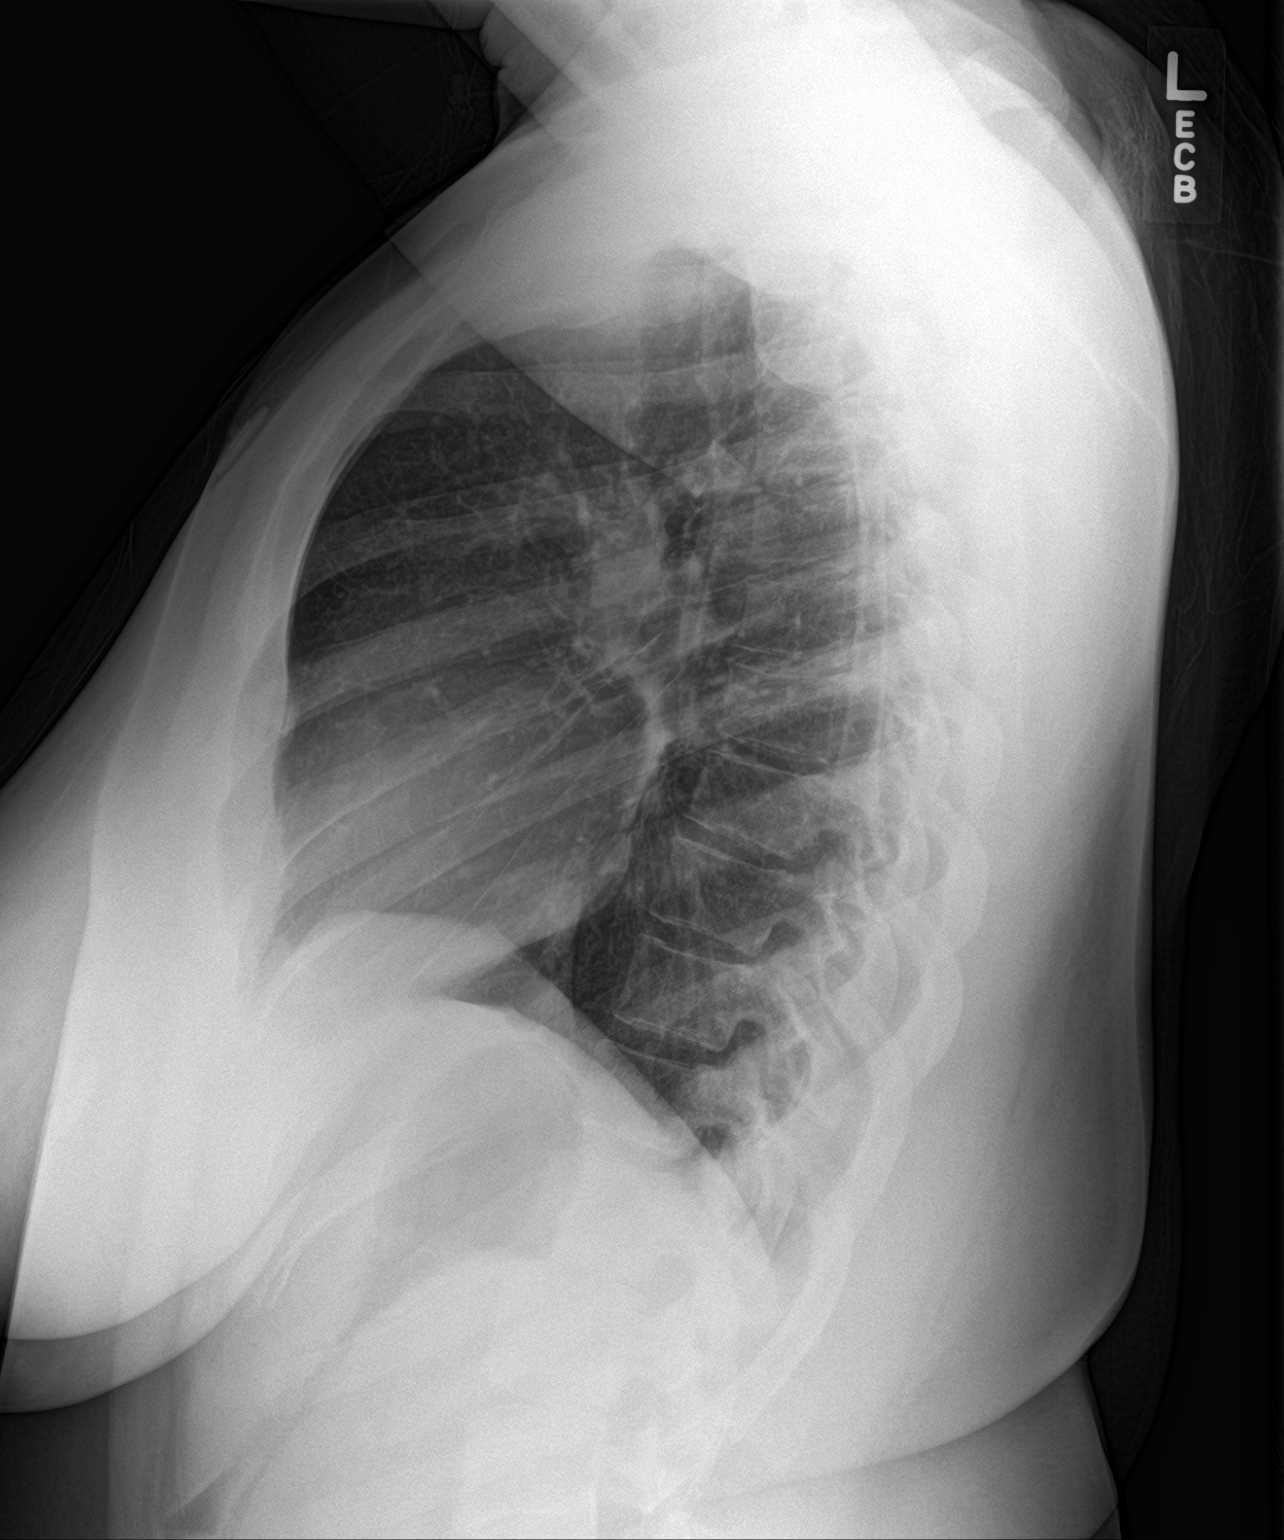

[2 of 2 positions shown; findings below may reference images not displayed]

FINDINGS: Blunting of the left costophrenic angle is best seen on the AP view
suggesting a small effusion or pleural thickening. The heart, hila,
and mediastinum are normal. No pneumothorax. No nodules or masses.
IMPRESSION: Small left pleural effusion versus pleural thickening. No other
acute abnormalities.
